# Patient Record
Sex: Female | Born: 1995 | Race: Black or African American | Hispanic: No | Smoking: Never smoker
Health system: Southern US, Community
[De-identification: ages and names within clinical notes are randomized; demographics above are authoritative.]

## PROBLEM LIST (undated history)

## (undated) DIAGNOSIS — L309 Dermatitis, unspecified: Secondary | ICD-10-CM

## (undated) DIAGNOSIS — R309 Painful micturition, unspecified: Secondary | ICD-10-CM

## (undated) DIAGNOSIS — B9689 Other specified bacterial agents as the cause of diseases classified elsewhere: Secondary | ICD-10-CM

## (undated) DIAGNOSIS — N390 Urinary tract infection, site not specified: Principal | ICD-10-CM

## (undated) DIAGNOSIS — J302 Other seasonal allergic rhinitis: Secondary | ICD-10-CM

## (undated) DIAGNOSIS — J45909 Unspecified asthma, uncomplicated: Secondary | ICD-10-CM

## (undated) DIAGNOSIS — R350 Frequency of micturition: Secondary | ICD-10-CM

## (undated) DIAGNOSIS — N760 Acute vaginitis: Secondary | ICD-10-CM

## (undated) HISTORY — DX: Urinary tract infection, site not specified: N39.0

## (undated) HISTORY — DX: Painful micturition, unspecified: R30.9

## (undated) HISTORY — DX: Other seasonal allergic rhinitis: J30.2

## (undated) HISTORY — DX: Unspecified asthma, uncomplicated: J45.909

## (undated) HISTORY — PX: NO PAST SURGERIES: SHX2092

## (undated) HISTORY — DX: Dermatitis, unspecified: L30.9

## (undated) HISTORY — DX: Frequency of micturition: R35.0

---
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
## 2016-01-23 ENCOUNTER — Ambulatory Visit: Payer: BLUE CROSS/BLUE SHIELD | Admitting: Advanced Practice Midwife

## 2016-01-29 ENCOUNTER — Ambulatory Visit: Payer: BLUE CROSS/BLUE SHIELD | Admitting: Advanced Practice Midwife

## 2016-04-16 ENCOUNTER — Other Ambulatory Visit: Payer: Self-pay | Admitting: Pediatrics

## 2016-04-16 ENCOUNTER — Telehealth: Payer: Self-pay

## 2016-04-16 DIAGNOSIS — J4531 Mild persistent asthma with (acute) exacerbation: Secondary | ICD-10-CM

## 2016-04-16 MED ORDER — ALBUTEROL SULFATE HFA 108 (90 BASE) MCG/ACT IN AERS
2.0000 | INHALATION_SPRAY | RESPIRATORY_TRACT | 0 refills | Status: DC | PRN
Start: 2016-04-16 — End: 2019-10-14

## 2016-04-16 NOTE — Telephone Encounter (Signed)
She doesn't need the box for a refill, the original pharmacy would have on record - but more likely she needs it at school - she needs to give us more info on the pharmacy

## 2016-04-16 NOTE — Telephone Encounter (Signed)
Called pt back and got phone and fax number for pharmacy. Phone number: [phone] Fax number: [phone]. Pt said that she has trouble when walking to class and will have to stop and take a break. When she is sitting down she can hear herself wheezing. I instructed pt to go to the clinic at school to be checked out as well. She voices understanding.

## 2016-04-16 NOTE — Telephone Encounter (Signed)
Pt called and explained that she has misplaced her Pro Air inhaler. She also can not find the box that has the prescription information on it to request a refill. [facility]. SHe is wondering if we will [facility].

## 2016-04-16 NOTE — Telephone Encounter (Signed)
Script called in

## 2016-11-11 ENCOUNTER — Encounter: Payer: Self-pay | Admitting: Pediatrics

## 2016-11-11 ENCOUNTER — Ambulatory Visit (INDEPENDENT_AMBULATORY_CARE_PROVIDER_SITE_OTHER): Payer: BLUE CROSS/BLUE SHIELD | Admitting: Pediatrics

## 2016-11-11 VITALS — BP 100/70 | Temp 97.4°F | Ht 66.93 in | Wt 143.8 lb

## 2016-11-11 DIAGNOSIS — Z23 Encounter for immunization: Secondary | ICD-10-CM

## 2016-11-11 DIAGNOSIS — L243 Irritant contact dermatitis due to cosmetics: Secondary | ICD-10-CM

## 2016-11-11 DIAGNOSIS — Z0000 Encounter for general adult medical examination without abnormal findings: Secondary | ICD-10-CM

## 2016-11-11 MED ORDER — TRIAMCINOLONE ACETONIDE 0.1 % EX OINT: 1.0000 "application " | TOPICAL_OINTMENT | Freq: Two times a day (BID) | CUTANEOUS | 3 refills | Status: DC

## 2016-11-11 NOTE — Progress Notes (Signed)
Asthma winter  eczema [id number] Routine Well-Adolescent Visit  Theresa David's personal or confidential phone number: [phone]  PCP: Theresa David, Theresa Client, MD   History was provided by the patient.  Theresa David is a 21 y.o. female who is here for well check.   Current concerns: feels she has flare of eczema on her armpits, is pruritic, no change in deoderant , no other rashes Has h/o asthma, generally only has issues in winter now, may use her inhaler>2x/week then ,has not needed since Jan.  Is in college, and works  Allergies  Allergen Reactions  . Shellfish Allergy Anaphylaxis and Rash    SEAFOOD    Current Outpatient Prescriptions on File Prior to Visit  Medication Sig Dispense Refill  . albuterol (PROAIR HFA) 108 (90 Base) MCG/ACT inhaler Inhale 2 puffs into the lungs every 4 (four) hours as needed for wheezing (Use with spacer). 18 g 0  . EPINEPHrine (EPIPEN) 0.3 mg/0.3 mL DEVI Inject 0.3 mLs (0.3 mg total) into the muscle once. 2 Device 1  . mometasone (ELOCON) 0.1 % cream Apply 1 application topically daily. 45 g 0   No current facility-administered medications on file prior to visit.     Past Medical History:  Diagnosis Date  . Asthma   . Bacterial vaginosis   . Eczema 09/06/2012  . Pain with urination 04/26/2015  . Seasonal allergies 09/06/2012  . Unspecified asthma(493.90) 09/06/2012  . Urinary frequency 08/01/2013  . UTI (lower urinary tract infection) 08/01/2013    ROS:     Constitutional  Afebrile, normal appetite, normal activity.   Opthalmologic  no irritation or drainage.   ENT  no rhinorrhea or congestion , no sore throat, no ear pain. Cardiovascular  No chest pain Respiratory  no cough , wheeze or chest pain.  Gastrointestinal  no abdominal pain, nausea or vomiting, bowel movements normal.     Genitourinary  no urgency, frequency or dysuria.   Musculoskeletal  no complaints of pain, no injuries.   Dermatologic  no rashes or lesions Neurologic - no  significant history of headaches, no weakness  family history includes Cancer (age of onset: 59) in her maternal aunt.    Adolescent Assessment:  Confidentiality was discussed with the patient and if applicable, with caregiver as well.  Home and Environment:  Social History   Social History Narrative   Attends college. Wants to be pediatric nurse   Lives with mom    No  smokers     Sports/Exercise:  regularly participates in sports coaches cheerleading  Education and Employment:  School Status: in college i School History: School attendance is regular. Work: Glass blower/designer Activities: cheerleading With parent out of the room and confidentiality discussed:  Patient here alone - adult  Patient reports being comfortable and safe at school and at home? Yes  Smoking: no Secondhand smoke exposure? no Drugs/EtOH: no   Sexuality:  -Menarche: age - females:  last menses: no problems  - Sexually active? yes -   - sexual partners in last year:  - contraception use: sees gyn - Last STI Screening: 05/2015  - Violence/Abuse: no  Mood: Suicidality and Depression: no Weapons:   Screenings:   PHQ-9 completed and results indicated no significant issues score 2   Hearing Screening   125Hz  250Hz  500Hz  1000Hz  2000Hz  3000Hz  4000Hz  6000Hz  8000Hz   Right ear:   25 25 25 25 25     Left ear:   25 25 25 25 25       Visual Acuity  Screening   Right eye Left eye Both eyes  Without correction: 20/20 20/20   With correction:         Physical Exam:  BP 100/70   Temp 97.4 F (36.3 C) (Temporal)   Ht 5' 6.93" (1.7 m)   Wt 143 lb 12.8 oz (65.2 kg)   BMI 22.57 kg/m   Weight: 74 %ile (Z= 0.64) based on CDC 2-20 Years weight-for-age data using vitals from 11/11/2016. Normalized weight-for-stature data available only for age 2 to 5 years.  Height: 85 %ile (Z= 1.03) based on CDC 2-20 Years stature-for-age data using vitals from 11/11/2016.  Blood pressure percentiles are  5.2 % systolic and 66.3 % diastolic based on the August 2017 AAP Clinical Practice Guideline.    Objective:         General alert in NAD  Derm   no rashes or lesions  Head Normocephalic, atraumatic                    Eyes Normal, no discharge  Ears:   TMs normal bilaterally  Nose:   patent normal mucosa, turbinates normal, no rhinorhea  Oral cavity  moist mucous membranes, no lesions  Throat:   normal tonsils, without exudate or erythema  Neck supple FROM  Lymph:   . no significant cervical adenopathy  Lungs:  clear with equal breath sounds bilaterally  Breast   Heart:   regular rate and rhythm, no murmur  Abdomen:  soft nontender no organomegaly or masses  GU:  normal female  back No deformity no scoliosis  Extremities:   no deformity,  Neuro:  intact no focal defects           Assessment/Plan:  1. Encounter for general adult medical examination without abnormal findings Normal growth and development  - POCT urinalysis dipstick - GC/Chlamydia Probe Amp  2. Irritant contact dermatitis due to cosmetics Pattern suggests deoderant as provocative agent. Advised hypoallergenic deoderant or talc - triamcinolone ointment (KENALOG) 0.1 %; Apply 1 application topically 2 (two) times daily.  Dispense: 60 g; Refill: 3  3. Need for vaccination  - HPV 9-valent vaccine,Recombinat .  BMI: is appropriate for age  Counseling completed for all of the following vaccine components  Orders Placed This Encounter  Procedures  . GC/Chlamydia Probe Amp  . HPV 9-valent vaccine,Recombinat  . POCT urinalysis dipstick    Return in 1 year (on 11/11/2017).  Theresa David.   Theresa Arnette Jo Latitia Housewright, MD

## 2016-11-11 NOTE — Patient Instructions (Signed)
Preventive Care for Theresa David, Female The transition to life after high school as a young adult can be a stressful time with many changes. You may start seeing a primary care physician instead of a pediatrician. This is the time when your health care becomes your responsibility. Preventive care refers to lifestyle choices and visits with your health care provider that can promote health and wellness. What does preventive care include?  A yearly physical exam. This is also called an annual wellness visit.  Dental exams once or twice a year.  Routine eye exams. Ask your health care provider how often you should have your eyes checked.  Personal lifestyle choices, including: ? Daily care of your teeth and gums. ? Regular physical activity. ? Eating a healthy diet. ? Avoiding tobacco and drug use. ? Avoiding or limiting alcohol use. ? Practicing safe sex. ? Taking vitamin and mineral supplements as recommended by your health care provider. What happens during an annual wellness visit? Preventive care starts with a yearly visit to your primary care physician. The services and screenings done by your health care provider during your annual wellness visit will depend on your overall health, lifestyle risk factors, and family history of disease. Counseling Your health care provider may ask you questions about:  Past medical problems and your family's medical history.  Medicines or supplements you take.  Health insurance and access to health care.  Alcohol, tobacco, and drug use.  Your safety at home, work, or school.  Access to firearms.  Emotional well-being and how you cope with stress.  Relationship well-being.  Diet, exercise, and sleep habits.  Your sexual health and activity.  Your methods of birth control.  Your menstrual cycle.  Your pregnancy history.  Screening You may have the following tests or measurements:  Height, weight, and BMI.  Blood  pressure.  Lipid and cholesterol levels.  Tuberculosis skin test.  Skin exam.  Vision and hearing tests.  Screening test for hepatitis.  Screening tests for sexually transmitted diseases (STDs), if you are at risk.  BRCA-related cancer screening. This may be done if you have a family history of breast, ovarian, tubal, or peritoneal cancers.  Pelvic exam and Pap test. This may be done every 3 years starting at age 35.  Vaccines Your health care provider may recommend certain vaccines, such as:  Influenza vaccine. This is recommended every year.  Tetanus, diphtheria, and acellular pertussis (Tdap, Td) vaccine. You may need a Td booster every 10 years.  Varicella vaccine. You may need this if you have not been vaccinated.  HPV vaccine. If you are 25 or younger, you may need three doses over 6 months.  Measles, mumps, and rubella (MMR) vaccine. You may need at least one dose of MMR. You may also need a second dose.  Pneumococcal 13-valent conjugate (PCV13) vaccine. You may need this if you have certain conditions and were not previously vaccinated.  Pneumococcal polysaccharide (PPSV23) vaccine. You may need one or two doses if you smoke cigarettes or if you have certain conditions.  Meningococcal vaccine. One dose is recommended if you are age 24-21 years and a first-year college student living in a residence hall, or if you have one of several medical conditions. You may also need additional booster doses.  Hepatitis A vaccine. You may need this if you have certain conditions or if you travel or work in places where you may be exposed to hepatitis A.  Hepatitis B vaccine. You may need this if  you have certain conditions or if you travel or work in places where you may be exposed to hepatitis B.  Haemophilus influenzae type b (Hib) vaccine. You may need this if you have certain risk factors.  Talk to your health care provider about which screenings and vaccines you need and how  often you need them. What steps can I take to develop healthy behaviors?  Have regular preventive health care visits with your primary care physician and dentist.  Eat a healthy diet.  Drink enough fluid to keep your urine clear or pale yellow.  Stay active. Exercise at least 30 minutes 5 or more days of the week.  Use alcohol responsibly.  Maintain a healthy weight.  Do not use any products that contain nicotine, such as cigarettes, chewing tobacco, and e-cigarettes. If you need help quitting, ask your health care provider.  Do not use drugs.  Practice safe sex.  Use birth control (contraception) to prevent unwanted pregnancy. If you plan to become pregnant, see your health care provider for a pre-conception visit.  Find healthy ways to manage stress. How can I protect myself from injury? Injuries from violence or accidents are the leading cause of death among young adults and can often be prevented. Take these steps to help protect yourself:  Always wear your seat belt while driving or riding in a vehicle.  Do not drive if you have been drinking alcohol. Do not ride with someone who has been drinking.  Do not drive when you are tired or distracted. Do not text while driving.  Wear a helmet and other protective equipment during sports activities.  If you have firearms in your house, make sure you follow all gun safety procedures.  Seek help if you have been bullied, physically abused, or sexually abused.  Use the Internet responsibly to avoid dangers such as online bullying and online sexual predators.  What can I do to cope with stress? Young adults may face many new challenges that can be stressful, such as finding a job, going to college, moving away from home, managing money, being in a relationship, getting married, and having children. To manage stress:  Avoid known stressful situations when you can.  Exercise regularly.  Find a stress-reducing activity that  works best for you. Examples include meditation, yoga, listening to music, or reading.  Spend time in nature.  Keep a journal to write about your stress and how you respond.  Talk to your health care provider about stress. He or she may suggest counseling.  Spend time with supportive friends or family.  Do not cope with stress by: ? Drinking alcohol or using drugs. ? Smoking cigarettes. ? Eating.  Where can I get more information? Learn more about preventive care and healthy habits from:  [place] and Gynecologists: KaraokeExchange.nl  U.S. Probation officer Task Force: StageSync.si  National Adolescent and [place]: StrategicRoad.nl  American Academy of Pediatrics Bright Futures: https://brightfutures.MemberVerification.co.za  Society for Adolescent Health and Medicine: MoralBlog.co.za.aspx  PodExchange.nl: ToyLending.fr  This information is not intended to replace advice given to you by your health care provider. Make sure you discuss any questions you have with your health care provider. Document Released: 09/20/2015 Document Revised: 10/11/2015 Document Reviewed: 09/20/2015 Elsevier Interactive Patient Education  2017 [employer].

## 2016-11-13 LAB — GC/CHLAMYDIA PROBE AMP
Chlamydia trachomatis, NAA: NEGATIVE
Neisseria gonorrhoeae by PCR: NEGATIVE

## 2017-02-11 ENCOUNTER — Encounter: Payer: Self-pay | Admitting: Women's Health

## 2017-02-11 ENCOUNTER — Encounter (INDEPENDENT_AMBULATORY_CARE_PROVIDER_SITE_OTHER): Payer: Self-pay

## 2017-02-11 ENCOUNTER — Ambulatory Visit (INDEPENDENT_AMBULATORY_CARE_PROVIDER_SITE_OTHER): Payer: BLUE CROSS/BLUE SHIELD | Admitting: Women's Health

## 2017-02-11 VITALS — BP 140/90 | HR 71 | Ht 65.0 in | Wt 143.0 lb

## 2017-02-11 DIAGNOSIS — Z3490 Encounter for supervision of normal pregnancy, unspecified, unspecified trimester: Secondary | ICD-10-CM | POA: Insufficient documentation

## 2017-02-11 DIAGNOSIS — Z3491 Encounter for supervision of normal pregnancy, unspecified, first trimester: Secondary | ICD-10-CM

## 2017-02-11 DIAGNOSIS — N926 Irregular menstruation, unspecified: Secondary | ICD-10-CM | POA: Diagnosis not present

## 2017-02-11 DIAGNOSIS — Z3201 Encounter for pregnancy test, result positive: Secondary | ICD-10-CM | POA: Diagnosis not present

## 2017-02-11 LAB — POCT URINE PREGNANCY: Preg Test, Ur: POSITIVE — AB

## 2017-02-11 NOTE — Patient Instructions (Signed)

## 2017-02-11 NOTE — Progress Notes (Signed)
   [facility] Clinic Visit  Patient name: Theresa David MRN [id number]  Date of birth: 1995-06-10 CC & HPI:  Theresa David is a 21 y.o. G46P0000 African American female at [redacted]w[redacted]d by LMP, being seen today for +HPT on Friday. Denies n/v. Has started taking otc gummie prenatal vitamins daily. Does not take any other medicines. Does not smoke. No complaints. Denies h/o HTN, just anxious about being here today.    Patient's last menstrual period was 01/06/2017. Last pap <21yo  Review of Systems:   Patient denies any headaches, hearing loss, fatigue, blurred vision, shortness of breath, chest pain, abdominal pain, problems with bowel movements, urination, or intercourse. No joint pain or mood swings.  Pertinent History Reviewed:  Reviewed past medical,surgical and family history.  Reviewed problem list, medications and allergies.  Objective Findings:   Vitals:   02/11/17 1204  BP: 140/90  Pulse: 71  Weight: 143 lb (64.9 kg)  Height:  (1.651 m)    Body mass index is 23.8 kg/m.  Physical Examination: General appearance - well appearing, and in no distress Mental status - alert, oriented to person, place, and time  Results for orders placed or performed in visit on 02/11/17 (from the past 24 hour(s))  POCT urine pregnancy   Collection Time: 02/11/17 12:06 PM  Result Value Ref Range   Preg Test, Ur Positive (A) Negative     Assessment & Plan:   1) [redacted]w[redacted]d pregnant by LMP>continue pnv daily, discussed warning s/s to report  Orders Placed This Encounter  Procedures  . POCT urine pregnancy    Return in about 2 Mika (around 02/25/2017) for dating u/s.  Marge Duncans CNM, [facility] 02/11/2017 12:18 PM

## 2017-02-19 ENCOUNTER — Telehealth: Payer: Self-pay | Admitting: Obstetrics & Gynecology

## 2017-02-19 NOTE — Telephone Encounter (Signed)
Informed patient she could try taking Colace or Miralax to help with constipation. Advise to push fluids and increase fiber intake or green leafy veggies, fiber one bars. Verbalized understanding.

## 2017-02-25 ENCOUNTER — Ambulatory Visit (INDEPENDENT_AMBULATORY_CARE_PROVIDER_SITE_OTHER): Payer: BLUE CROSS/BLUE SHIELD

## 2017-02-25 DIAGNOSIS — Z3491 Encounter for supervision of normal pregnancy, unspecified, first trimester: Secondary | ICD-10-CM

## 2017-02-25 DIAGNOSIS — Z3A01 Less than 8 weeks gestation of pregnancy: Secondary | ICD-10-CM

## 2017-02-25 NOTE — Progress Notes (Signed)
[place] 7+1 wks,single IUP w/ys,positive fht 126 bpm,normal ovaries bilat,crl 10.2 mm,EDD 10/13/2017 by LMP

## 2017-03-11 ENCOUNTER — Ambulatory Visit: Payer: BLUE CROSS/BLUE SHIELD | Admitting: *Deleted

## 2017-03-11 ENCOUNTER — Encounter: Payer: Self-pay | Admitting: Advanced Practice Midwife

## 2017-03-11 ENCOUNTER — Ambulatory Visit (INDEPENDENT_AMBULATORY_CARE_PROVIDER_SITE_OTHER): Payer: BLUE CROSS/BLUE SHIELD | Admitting: Advanced Practice Midwife

## 2017-03-11 VITALS — BP 112/74 | HR 72 | Wt 145.0 lb

## 2017-03-11 DIAGNOSIS — Z3A09 9 weeks gestation of pregnancy: Secondary | ICD-10-CM | POA: Diagnosis not present

## 2017-03-11 DIAGNOSIS — Z3400 Encounter for supervision of normal first pregnancy, unspecified trimester: Secondary | ICD-10-CM

## 2017-03-11 DIAGNOSIS — Z3401 Encounter for supervision of normal first pregnancy, first trimester: Secondary | ICD-10-CM | POA: Diagnosis not present

## 2017-03-11 DIAGNOSIS — O0990 Supervision of high risk pregnancy, unspecified, unspecified trimester: Secondary | ICD-10-CM | POA: Insufficient documentation

## 2017-03-11 DIAGNOSIS — Z1389 Encounter for screening for other disorder: Secondary | ICD-10-CM

## 2017-03-11 DIAGNOSIS — Z3682 Encounter for antenatal screening for nuchal translucency: Secondary | ICD-10-CM

## 2017-03-11 DIAGNOSIS — Z331 Pregnant state, incidental: Secondary | ICD-10-CM

## 2017-03-11 LAB — POCT URINALYSIS DIPSTICK
GLUCOSE UA: NEGATIVE
KETONES UA: NEGATIVE
Leukocytes, UA: NEGATIVE
NITRITE UA: NEGATIVE
Protein, UA: NEGATIVE
RBC UA: NEGATIVE

## 2017-03-11 MED ORDER — DOXYLAMINE-PYRIDOXINE 10-10 MG PO TBEC: DELAYED_RELEASE_TABLET | ORAL | 3 refills | Status: DC

## 2017-03-11 NOTE — Patient Instructions (Signed)
 First Trimester of Pregnancy The first trimester of pregnancy is from week 1 until the end of week 12 (months 1 through 3). A week after a sperm fertilizes an egg, the egg will implant on the wall of the uterus. This embryo will begin to develop into a baby. Genes from you and your partner are forming the baby. The female genes determine whether the baby is a boy or a girl. At 6-8 Panjwani, the eyes and face are formed, and the heartbeat can be seen on ultrasound. At the end of 12 [place], all the baby's organs are formed.  Now that you are pregnant, you will want to do everything you can to have a healthy baby. Two of the most important things are to get good prenatal care and to follow your health care provider's instructions. Prenatal care is all the medical care you receive before the baby's birth. This care will help prevent, find, and treat any problems during the pregnancy and childbirth. BODY CHANGES Your body goes through many changes during pregnancy. The changes vary from woman to woman.   You may gain or lose a couple of pounds at first.  You may feel sick to your stomach (nauseous) and throw up (vomit). If the vomiting is uncontrollable, call your health care provider.  You may tire easily.  You may develop headaches that can be relieved by medicines approved by your health care provider.  You may urinate more often. Painful urination may mean you have a bladder infection.  You may develop heartburn as a result of your pregnancy.  You may develop constipation because certain hormones are causing the muscles that push waste through your intestines to slow down.  You may develop hemorrhoids or swollen, bulging veins (varicose veins).  Your breasts may begin to grow larger and become tender. Your nipples may stick out more, and the tissue that surrounds them (areola) may become darker.  Your gums may bleed and may be sensitive to brushing and flossing.  Dark spots or blotches  (chloasma, mask of pregnancy) may develop on your face. This will likely fade after the baby is born.  Your menstrual periods will stop.  You may have a loss of appetite.  You may develop cravings for certain kinds of food.  You may have changes in your emotions from day to day, such as being excited to be pregnant or being concerned that something may go wrong with the pregnancy and baby.  You may have more vivid and strange dreams.  You may have changes in your hair. These can include thickening of your hair, rapid growth, and changes in texture. Some women also have hair loss during or after pregnancy, or hair that feels dry or thin. Your hair will most likely return to normal after your baby is born. WHAT TO EXPECT AT YOUR PRENATAL VISITS During a routine prenatal visit:  You will be weighed to make sure you and the baby are growing normally.  Your blood pressure will be taken.  Your abdomen will be measured to track your baby's growth.  The fetal heartbeat will be listened to starting around week 10 or 12 of your pregnancy.  Test results from any previous visits will be discussed. Your health care provider may ask you:  How you are feeling.  If you are feeling the baby move.  If you have had any abnormal symptoms, such as leaking fluid, bleeding, severe headaches, or abdominal cramping.  If you have any questions. Other   tests that may be performed during your first trimester include:  Blood tests to find your blood type and to check for the presence of any previous infections. They will also be used to check for low iron levels (anemia) and Rh antibodies. Later in the pregnancy, blood tests for diabetes will be done along with other tests if problems develop.  Urine tests to check for infections, diabetes, or protein in the urine.  An ultrasound to confirm the proper growth and development of the baby.  An amniocentesis to check for possible genetic problems.  Fetal  screens for spina bifida and Down syndrome.  You may need other tests to make sure you and the baby are doing well. HOME CARE INSTRUCTIONS  Medicines  Follow your health care provider's instructions regarding medicine use. Specific medicines may be either safe or unsafe to take during pregnancy.  Take your prenatal vitamins as directed.  If you develop constipation, try taking a stool softener if your health care provider approves. Diet  Eat regular, well-balanced meals. Choose a variety of foods, such as meat or vegetable-based protein, fish, milk and low-fat dairy products, vegetables, fruits, and whole grain breads and cereals. Your health care provider will help you determine the amount of weight gain that is right for you.  Avoid raw meat and uncooked cheese. These carry germs that can cause birth defects in the baby.  Eating four or five small meals rather than three large meals a day may help relieve nausea and vomiting. If you start to feel nauseous, eating a few soda crackers can be helpful. Drinking liquids between meals instead of during meals also seems to help nausea and vomiting.  If you develop constipation, eat more high-fiber foods, such as fresh vegetables or fruit and whole grains. Drink enough fluids to keep your urine clear or pale yellow. Activity and Exercise  Exercise only as directed by your health care provider. Exercising will help you:  Control your weight.  Stay in shape.  Be prepared for labor and delivery.  Experiencing pain or cramping in the lower abdomen or low back is a good sign that you should stop exercising. Check with your health care provider before continuing normal exercises.  Try to avoid standing for long periods of time. Move your legs often if you must stand in one place for a long time.  Avoid heavy lifting.  Wear low-heeled shoes, and practice good posture.  You may continue to have sex unless your health care provider directs you  otherwise. Relief of Pain or Discomfort  Wear a good support bra for breast tenderness.   Take warm sitz baths to soothe any pain or discomfort caused by hemorrhoids. Use hemorrhoid cream if your health care provider approves.   Rest with your legs elevated if you have leg cramps or low back pain.  If you develop varicose veins in your legs, wear support hose. Elevate your feet for 15 minutes, 3-4 times a day. Limit salt in your diet. Prenatal Care  Schedule your prenatal visits by the twelfth week of pregnancy. They are usually scheduled monthly at first, then more often in the last 2 months before delivery.  Write down your questions. Take them to your prenatal visits.  Keep all your prenatal visits as directed by your health care provider. Safety  Wear your seat belt at all times when driving.  Make a list of emergency phone numbers, including numbers for family, friends, the hospital, and police and fire departments. General   Tips  Ask your health care provider for a referral to a local prenatal education class. Begin classes no later than at the beginning of month 6 of your pregnancy.  Ask for help if you have counseling or nutritional needs during pregnancy. Your health care provider can offer advice or refer you to specialists for help with various needs.  Do not use hot tubs, steam rooms, or saunas.  Do not douche or use tampons or scented sanitary pads.  Do not cross your legs for long periods of time.  Avoid cat litter boxes and soil used by cats. These carry germs that can cause birth defects in the baby and possibly loss of the fetus by miscarriage or stillbirth.  Avoid all smoking, herbs, alcohol, and medicines not prescribed by your health care provider. Chemicals in these affect the formation and growth of the baby.  Schedule a dentist appointment. At home, brush your teeth with a soft toothbrush and be gentle when you floss. SEEK MEDICAL CARE IF:   You have  dizziness.  You have mild pelvic cramps, pelvic pressure, or nagging pain in the abdominal area.  You have persistent nausea, vomiting, or diarrhea.  You have a bad smelling vaginal discharge.  You have pain with urination.  You notice increased swelling in your face, hands, legs, or ankles. SEEK IMMEDIATE MEDICAL CARE IF:   You have a fever.  You are leaking fluid from your vagina.  You have spotting or bleeding from your vagina.  You have severe abdominal cramping or pain.  You have rapid weight gain or loss.  You vomit blood or material that looks like coffee grounds.  You are exposed to German measles and have never had them.  You are exposed to fifth disease or chickenpox.  You develop a severe headache.  You have shortness of breath.  You have any kind of trauma, such as from a fall or a car accident. Document Released: 04/29/2001 Document Revised: 09/19/2013 Document Reviewed: 03/15/2013 Exit[employer] Patient Information 2015 [employer]. This information is not intended to replace advice given to you by your health care provider. Make sure you discuss any questions you have with your health care provider.   Nausea & Vomiting  Have saltine crackers or pretzels by your bed and eat a few bites before you raise your head out of bed in the morning  Eat small frequent meals throughout the day instead of large meals  Drink plenty of fluids throughout the day to stay hydrated, just don't drink a lot of fluids with your meals.  This can make your stomach fill up faster making you feel sick  Do not brush your teeth right after you eat  Products with real ginger are good for nausea, like ginger ale and ginger hard candy Make sure it says made with real ginger!  Sucking on sour candy like lemon heads is also good for nausea  If your prenatal vitamins make you nauseated, take them at night so you will sleep through the nausea  Sea Bands  If you feel like you need  medicine for the nausea & vomiting please let us know  If you are unable to keep any fluids or food down please let us know   Constipation  Drink plenty of fluid, preferably water, throughout the day  Eat foods high in fiber such as fruits, vegetables, and grains  Exercise, such as walking, is a good way to keep your bowels regular  Drink warm fluids, especially warm   prune juice, or decaf coffee  Eat a 1/2 cup of real oatmeal (not instant), 1/2 cup applesauce, and 1/2-1 cup warm prune juice every day  If needed, you may take Colace (docusate sodium) stool softener once or twice a day to help keep the stool soft. If you are pregnant, wait until you are out of your first trimester (12-14 [place] of pregnancy)  If you still are having problems with constipation, you may take Miralax once daily as needed to help keep your bowels regular.  If you are pregnant, wait until you are out of your first trimester (12-14 [place] of pregnancy)  Safe Medications in Pregnancy   Acne: Benzoyl Peroxide Salicylic Acid  Backache/Headache: Tylenol: 2 regular strength every 4 hours OR              2 Extra strength every 6 hours  Colds/Coughs/Allergies: Benadryl (alcohol free) 25 mg every 6 hours as needed Breath right strips Claritin Cepacol throat lozenges Chloraseptic throat spray Cold-Eeze- up to three times per day Cough drops, alcohol free Flonase (by prescription only) Guaifenesin Mucinex Robitussin DM (plain only, alcohol free) Saline nasal spray/drops Sudafed (pseudoephedrine) & Actifed ** use only after 12 Gadea gestation and if you do not have high blood pressure Tylenol Vicks Vaporub Zinc lozenges Zyrtec   Constipation: Colace Ducolax suppositories Fleet enema Glycerin suppositories Metamucil Milk of magnesia Miralax Senokot Smooth move tea  Diarrhea: Kaopectate Imodium A-D  *NO pepto Bismol  Hemorrhoids: Anusol Anusol HC Preparation  H Tucks  Indigestion: Tums Maalox Mylanta Zantac  Pepcid  Insomnia: Benadryl (alcohol free) 25mg every 6 hours as needed Tylenol PM Unisom, no Gelcaps  Leg Cramps: Tums MagGel  Nausea/Vomiting:  Bonine Dramamine Emetrol Ginger extract Sea bands Meclizine  Nausea medication to take during pregnancy:  Unisom (doxylamine succinate 25 mg tablets) Take one tablet daily at bedtime. If symptoms are not adequately controlled, the dose can be increased to a maximum recommended dose of two tablets daily (1/2 tablet in the morning, 1/2 tablet mid-afternoon and one at bedtime). Vitamin B6 100mg tablets. Take one tablet twice a day (up to 200 mg per day).  Skin Rashes: Aveeno products Benadryl cream or 25mg every 6 hours as needed Calamine Lotion 1% cortisone cream  Yeast infection: Gyne-lotrimin 7 Monistat 7   **If taking multiple medications, please check labels to avoid duplicating the same active ingredients **take medication as directed on the label ** Do not exceed 4000 mg of tylenol in 24 hours **Do not take medications that contain aspirin or ibuprofen      

## 2017-03-11 NOTE — Progress Notes (Signed)
  Subjective:    Theresa GoslingValencia David is a G1P0000 6633w1d being seen today for her first obstetrical visit.  Her obstetrical history is significant for first pregnancy.  Pregnancy history fully reviewed.  Patient reports nausea.  Vitals:   03/11/17 1032  BP: 112/74  Pulse: 72  Weight: 145 lb (65.8 kg)    HISTORY: OB History  Gravida Para Term Preterm AB Living  1 0 0 0 0 0  SAB TAB Ectopic Multiple Live Births  0 0 0 0      # Outcome Date GA Lbr Len/2nd Weight Sex Delivery Anes PTL Lv  1 Current              Past Medical History:  Diagnosis Date  . Asthma   . Bacterial vaginosis   . Eczema 09/06/2012  . Pain with urination 04/26/2015  . Seasonal allergies 09/06/2012  . Unspecified asthma(493.90) 09/06/2012  . Urinary frequency 08/01/2013  . UTI (lower urinary tract infection) 08/01/2013   History reviewed. No pertinent surgical history. Family History  Problem Relation Age of Onset  . Cancer Maternal Aunt 44       breast   . Dementia Maternal Grandmother      Exam                                      System:     Skin: normal coloration and turgor, no rashes    Neurologic: oriented, normal, normal mood   Extremities: normal strength, tone, and muscle mass   HEENT PERRLA   Mouth/Teeth mucous membranes moist, normal dentition   Neck supple and no masses   Cardiovascular: regular rate and rhythm   Respiratory:  appears well, vitals normal, no respiratory distress, acyanotic   Abdomen: soft, non-tender;  FHR: 160US        The nature [facility] with multiple MDs and other Advanced Practice Providers was explained to patient; also emphasized that residents, students are part of our team.  Assessment:    Pregnancy: G1P0000 Patient Active Problem List   Diagnosis Date Noted  . Supervision of normal first pregnancy, antepartum 03/11/2017  . Stress and adjustment reaction 03/25/2013  . Mild persistent asthma with acute  exacerbation 09/06/2012  . Eczema 09/06/2012        Plan:     Initial labs drawn. Continue prenatal vitamins  Rx diclegis pre auth done)_ Problem list reviewed and updated  Reviewed n/v relief measures and warning s/s to report  Reviewed recommended weight gain based on pre-gravid BMI  Encouraged well-balanced diet Genetic Screening discussed Integrated Screen: requested.  Ultrasound discussed; fetal survey: requested.  Return in about 3 [place] (around 04/01/2017) for LROB, US:NT+1st IT.  CRESENZO-DISHMAN,Clydean Posas 03/11/2017

## 2017-03-14 LAB — PMP SCREEN PROFILE (10S), URINE
AMPHETAMINE SCREEN URINE: NEGATIVE ng/mL
BARBITURATE SCREEN URINE: NEGATIVE ng/mL
BENZODIAZEPINE SCREEN, URINE: NEGATIVE ng/mL
CANNABINOIDS UR QL SCN: NEGATIVE ng/mL
COCAINE(METAB.)SCREEN, URINE: NEGATIVE ng/mL
Creatinine(Crt), U: 193.1 mg/dL (ref 20.0–300.0)
METHADONE SCREEN, URINE: NEGATIVE ng/mL
OPIATE SCREEN URINE: NEGATIVE ng/mL
OXYCODONE+OXYMORPHONE UR QL SCN: NEGATIVE ng/mL
PHENCYCLIDINE QUANTITATIVE URINE: NEGATIVE ng/mL
PROPOXYPHENE SCREEN URINE: NEGATIVE ng/mL
Ph of Urine: 8.4 (ref 4.5–8.9)

## 2017-03-14 LAB — URINE CULTURE

## 2017-03-15 LAB — GC/CHLAMYDIA PROBE AMP
Chlamydia trachomatis, NAA: NEGATIVE
Neisseria gonorrhoeae by PCR: NEGATIVE

## 2017-03-18 LAB — MICROSCOPIC EXAMINATION: Casts: NONE SEEN /lpf

## 2017-03-18 LAB — CYSTIC FIBROSIS MUTATION 97: Interpretation: NOT DETECTED

## 2017-03-18 LAB — VARICELLA ZOSTER ANTIBODY, IGG

## 2017-03-18 LAB — URINALYSIS, ROUTINE W REFLEX MICROSCOPIC
BILIRUBIN UA: NEGATIVE
Glucose, UA: NEGATIVE
Ketones, UA: NEGATIVE
NITRITE UA: NEGATIVE
PH UA: 8 — AB (ref 5.0–7.5)
Protein, UA: NEGATIVE
RBC UA: NEGATIVE
Specific Gravity, UA: 1.022 (ref 1.005–1.030)
UUROB: 0.2 mg/dL (ref 0.2–1.0)

## 2017-03-18 LAB — SICKLE CELL SCREEN: Sickle Cell Screen: NEGATIVE

## 2017-03-18 LAB — RUBELLA SCREEN: Rubella Antibodies, IGG: 3.57 index (ref >0.99)

## 2017-03-18 LAB — CBC
HEMATOCRIT: 36.2 % (ref 34.0–46.6)
HEMOGLOBIN: 11.8 g/dL (ref 11.1–15.9)
MCH: 29.1 pg (ref 26.6–33.0)
MCHC: 32.6 g/dL (ref 31.5–35.7)
MCV: 89 fL (ref 79–97)
Platelets: 281 10*3/uL (ref 150–379)
RBC: 4.06 x10E6/uL (ref 3.77–5.28)
RDW: 14.3 % (ref 12.3–15.4)
WBC: 5 10*3/uL (ref 3.4–10.8)

## 2017-03-18 LAB — RPR: RPR: NONREACTIVE

## 2017-03-18 LAB — HIV ANTIBODY (ROUTINE TESTING W REFLEX): HIV Screen 4th Generation wRfx: NONREACTIVE

## 2017-03-18 LAB — ANTIBODY SCREEN: ANTIBODY SCREEN: NEGATIVE

## 2017-03-18 LAB — ABO/RH: Rh Factor: POSITIVE

## 2017-03-18 LAB — HEPATITIS B SURFACE ANTIGEN: HEP B S AG: NEGATIVE

## 2017-03-31 ENCOUNTER — Other Ambulatory Visit (HOSPITAL_COMMUNITY): Payer: Self-pay | Admitting: Advanced Practice Midwife

## 2017-03-31 DIAGNOSIS — Z3682 Encounter for antenatal screening for nuchal translucency: Secondary | ICD-10-CM

## 2017-04-01 ENCOUNTER — Ambulatory Visit (INDEPENDENT_AMBULATORY_CARE_PROVIDER_SITE_OTHER): Payer: BLUE CROSS/BLUE SHIELD | Admitting: Women's Health

## 2017-04-01 ENCOUNTER — Ambulatory Visit (INDEPENDENT_AMBULATORY_CARE_PROVIDER_SITE_OTHER): Payer: BLUE CROSS/BLUE SHIELD

## 2017-04-01 ENCOUNTER — Encounter: Payer: Self-pay | Admitting: Women's Health

## 2017-04-01 VITALS — BP 110/58 | HR 70 | Wt 144.5 lb

## 2017-04-01 DIAGNOSIS — Z3682 Encounter for antenatal screening for nuchal translucency: Secondary | ICD-10-CM

## 2017-04-01 DIAGNOSIS — Z3401 Encounter for supervision of normal first pregnancy, first trimester: Secondary | ICD-10-CM

## 2017-04-01 DIAGNOSIS — Z3400 Encounter for supervision of normal first pregnancy, unspecified trimester: Secondary | ICD-10-CM

## 2017-04-01 DIAGNOSIS — Z331 Pregnant state, incidental: Secondary | ICD-10-CM | POA: Diagnosis not present

## 2017-04-01 DIAGNOSIS — Z23 Encounter for immunization: Secondary | ICD-10-CM

## 2017-04-01 DIAGNOSIS — Z3A12 12 weeks gestation of pregnancy: Secondary | ICD-10-CM

## 2017-04-01 DIAGNOSIS — Z1389 Encounter for screening for other disorder: Secondary | ICD-10-CM

## 2017-04-01 LAB — POCT URINALYSIS DIPSTICK
GLUCOSE UA: NEGATIVE
Ketones, UA: NEGATIVE
Leukocytes, UA: NEGATIVE
NITRITE UA: NEGATIVE
PROTEIN UA: NEGATIVE
RBC UA: NEGATIVE

## 2017-04-01 NOTE — Progress Notes (Signed)
US 12+1 wks,measurements c/w dates,crl 55.92 mm,NT 1 mm,NB present,fhr 162 bpm,normal ovaries bilat

## 2017-04-01 NOTE — Progress Notes (Signed)
   LOW-RISK PREGNANCY VISIT Patient name: Theresa David MRN [id number]  Date of birth: 1995/06/17 Chief Complaint:   Routine Prenatal Visit (Intergrated 1 )  History of Present Illness:   Theresa David is a 21 y.o. 261P0000 female at 4314w1d with an Estimated Date of Delivery: 10/13/17 being seen today for ongoing management of a low-risk pregnancy.  Today she reports no complaints.  . Vag. Bleeding: None.  Movement: Absent. denies leaking of fluid. Review of Systems:   Pertinent items are noted in HPI Denies abnormal vaginal discharge w/ itching/odor/irritation, headaches, visual changes, shortness of breath, chest pain, abdominal pain, severe nausea/vomiting, or problems with urination or bowel movements unless otherwise stated above. Pertinent History Reviewed:  Reviewed past medical,surgical, social, obstetrical and family history.  Reviewed problem list, medications and allergies. Physical Assessment:   Vitals:   04/01/17 1016  BP: (!) 110/58  Pulse: 70  Weight: 144 lb 8 oz (65.5 kg)  Body mass index is 24.05 kg/m.        Physical Examination:   General appearance: Well appearing, and in no distress  Mental status: Alert, oriented to person, place, and time  Skin: Warm & dry  Cardiovascular: Normal heart rate noted  Respiratory: Normal respiratory effort, no distress  Abdomen: Soft, gravid, nontender  Pelvic: Cervical exam deferred         Extremities: Edema: None  Fetal Status: Fetal Heart Rate (bpm): + u/s   Movement: Absent   NT U/S: US 12+1 wks,measurements c/w dates,crl 55.92 mm,NT 1 mm,NB present,fhr 162 bpm,normal ovaries bilat  Results for orders placed or performed in visit on 04/01/17 (from the past 24 hour(s))  POCT Urinalysis Dipstick   Collection Time: 04/01/17 10:15 AM  Result Value Ref Range   Color, UA     Clarity, UA     Glucose, UA neg    Bilirubin, UA     Ketones, UA neg    Spec Grav, UA  1.010 - 1.025   Blood, UA neg    pH, UA  5.0 - 8.0   Protein, UA neg    Urobilinogen, UA  0.2 or 1.0 E.U./dL   Nitrite, UA neg    Leukocytes, UA Negative Negative    Assessment & Plan:  1) Low-risk pregnancy G1P0000 at 1214w1d with an Estimated Date of Delivery: 10/13/17    Labs/procedures today: 1st IT/NT, flu shot  Plan:  Continue routine obstetrical care   Reviewed: Preterm labor symptoms and general obstetric precautions including but not limited to vaginal bleeding, contractions, leaking of fluid and fetal movement were reviewed in detail with the patient.  All questions were answered  Follow-up: Return in about 4 [place] (around 04/29/2017) for LROB, 2nd IT.  Orders Placed This Encounter  Procedures  . Integrated 1  . POCT Urinalysis Dipstick   Marge DuncansBooker, Kersten Salmons Randall CNM, [facility]NP-BC 04/01/2017 10:40 AM

## 2017-04-01 NOTE — Patient Instructions (Signed)
Theresa GoslingValencia Gales, I greatly value your feedback.  If you receive a survey following your visit with us today, we appreciate you taking the time to fill it out.  Thanks, Joellyn HaffKim Tyri Elmore, CNM, WHNP-BC   Second Trimester of Pregnancy The second trimester is from week 14 through week 27 (months 4 through 6). The second trimester is often a time when you feel your best. Your body has adjusted to being pregnant, and you begin to feel better physically. Usually, morning sickness has lessened or quit completely, you may have more energy, and you may have an increase in appetite. The second trimester is also a time when the fetus is growing rapidly. At the end of the sixth month, the fetus is about 9 inches long and weighs about 1 pounds. You will likely begin to feel the baby move (quickening) between 16 and 20 [place] of pregnancy. Body changes during your second trimester Your body continues to go through many changes during your second trimester. The changes vary from woman to woman.  Your weight will continue to increase. You will notice your lower abdomen bulging out.  You may begin to get stretch marks on your hips, abdomen, and breasts.  You may develop headaches that can be relieved by medicines. The medicines should be approved by your health care provider.  You may urinate more often because the fetus is pressing on your bladder.  You may develop or continue to have heartburn as a result of your pregnancy.  You may develop constipation because certain hormones are causing the muscles that push waste through your intestines to slow down.  You may develop hemorrhoids or swollen, bulging veins (varicose veins).  You may have back pain. This is caused by: ? Weight gain. ? Pregnancy hormones that are relaxing the joints in your pelvis. ? A shift in weight and the muscles that support your balance.  Your breasts will continue to grow and they will continue to become tender.  Your gums may bleed and  may be sensitive to brushing and flossing.  Dark spots or blotches (chloasma, mask of pregnancy) may develop on your face. This will likely fade after the baby is born.  A dark line from your belly button to the pubic area (linea nigra) may appear. This will likely fade after the baby is born.  You may have changes in your hair. These can include thickening of your hair, rapid growth, and changes in texture. Some women also have hair loss during or after pregnancy, or hair that feels dry or thin. Your hair will most likely return to normal after your baby is born.  What to expect at prenatal visits During a routine prenatal visit:  You will be weighed to make sure you and the fetus are growing normally.  Your blood pressure will be taken.  Your abdomen will be measured to track your baby's growth.  The fetal heartbeat will be listened to.  Any test results from the previous visit will be discussed.  Your health care provider may ask you:  How you are feeling.  If you are feeling the baby move.  If you have had any abnormal symptoms, such as leaking fluid, bleeding, severe headaches, or abdominal cramping.  If you are using any tobacco products, including cigarettes, chewing tobacco, and electronic cigarettes.  If you have any questions.  Other tests that may be performed during your second trimester include:  Blood tests that check for: ? Low iron levels (anemia). ? High blood  sugar that affects pregnant women (gestational diabetes) between 55 and 28 [place]. ? Rh antibodies. This is to check for a protein on red blood cells (Rh factor).  Urine tests to check for infections, diabetes, or protein in the urine.  An ultrasound to confirm the proper growth and development of the baby.  An amniocentesis to check for possible genetic problems.  Fetal screens for spina bifida and Down syndrome.  HIV (human immunodeficiency virus) testing. Routine prenatal testing includes  screening for HIV, unless you choose not to have this test.  Follow these instructions at home: Medicines  Follow your health care provider's instructions regarding medicine use. Specific medicines may be either safe or unsafe to take during pregnancy.  Take a prenatal vitamin that contains at least 600 micrograms (mcg) of folic acid.  If you develop constipation, try taking a stool softener if your health care provider approves. Eating and drinking  Eat a balanced diet that includes fresh fruits and vegetables, whole grains, good sources of protein such as meat, eggs, or tofu, and low-fat dairy. Your health care provider will help you determine the amount of weight gain that is right for you.  Avoid raw meat and uncooked cheese. These carry germs that can cause birth defects in the baby.  If you have low calcium intake from food, talk to your health care provider about whether you should take a daily calcium supplement.  Limit foods that are high in fat and processed sugars, such as fried and sweet foods.  To prevent constipation: ? Drink enough fluid to keep your urine clear or pale yellow. ? Eat foods that are high in fiber, such as fresh fruits and vegetables, whole grains, and beans. Activity  Exercise only as directed by your health care provider. Most women can continue their usual exercise routine during pregnancy. Try to exercise for 30 minutes at least 5 days a week. Stop exercising if you experience uterine contractions.  Avoid heavy lifting, wear low heel shoes, and practice good posture.  A sexual relationship may be continued unless your health care provider directs you otherwise. Relieving pain and discomfort  Wear a good support bra to prevent discomfort from breast tenderness.  Take warm sitz baths to soothe any pain or discomfort caused by hemorrhoids. Use hemorrhoid cream if your health care provider approves.  Rest with your legs elevated if you have leg cramps  or low back pain.  If you develop varicose veins, wear support hose. Elevate your feet for 15 minutes, 3-4 times a day. Limit salt in your diet. Prenatal Care  Write down your questions. Take them to your prenatal visits.  Keep all your prenatal visits as told by your health care provider. This is important. Safety  Wear your seat belt at all times when driving.  Make a list of emergency phone numbers, including numbers for family, friends, the hospital, and police and fire departments. General instructions  Ask your health care provider for a referral to a local prenatal education class. Begin classes no later than the beginning of month 6 of your pregnancy.  Ask for help if you have counseling or nutritional needs during pregnancy. Your health care provider can offer advice or refer you to specialists for help with various needs.  Do not use hot tubs, steam rooms, or saunas.  Do not douche or use tampons or scented sanitary pads.  Do not cross your legs for long periods of time.  Avoid cat litter boxes and soil used  by cats. These carry germs that can cause birth defects in the baby and possibly loss of the fetus by miscarriage or stillbirth.  Avoid all smoking, herbs, alcohol, and unprescribed drugs. Chemicals in these products can affect the formation and growth of the baby.  Do not use any products that contain nicotine or tobacco, such as cigarettes and e-cigarettes. If you need help quitting, ask your health care provider.  Visit your dentist if you have not gone yet during your pregnancy. Use a soft toothbrush to brush your teeth and be gentle when you floss. Contact a health care provider if:  You have dizziness.  You have mild pelvic cramps, pelvic pressure, or nagging pain in the abdominal area.  You have persistent nausea, vomiting, or diarrhea.  You have a bad smelling vaginal discharge.  You have pain when you urinate. Get help right away if:  You have a  fever.  You are leaking fluid from your vagina.  You have spotting or bleeding from your vagina.  You have severe abdominal cramping or pain.  You have rapid weight gain or weight loss.  You have shortness of breath with chest pain.  You notice sudden or extreme swelling of your face, hands, ankles, feet, or legs.  You have not felt your baby move in over an hour.  You have severe headaches that do not go away when you take medicine.  You have vision changes. Summary  The second trimester is from week 14 through week 27 (months 4 through 6). It is also a time when the fetus is growing rapidly.  Your body goes through many changes during pregnancy. The changes vary from woman to woman.  Avoid all smoking, herbs, alcohol, and unprescribed drugs. These chemicals affect the formation and growth your baby.  Do not use any tobacco products, such as cigarettes, chewing tobacco, and e-cigarettes. If you need help quitting, ask your health care provider.  Contact your health care provider if you have any questions. Keep all prenatal visits as told by your health care provider. This is important. This information is not intended to replace advice given to you by your health care provider. Make sure you discuss any questions you have with your health care provider. Document Released: 04/29/2001 Document Revised: 10/11/2015 Document Reviewed: 07/06/2012 Elsevier Interactive Patient Education  2017 [employer].

## 2017-04-01 NOTE — Addendum Note (Signed)
Addended by: Sherre LainASH, AMANDA A on: 04/01/2017 10:52 AM   Modules accepted: Orders

## 2017-04-03 LAB — INTEGRATED 1
Crown Rump Length: 55.9 mm
Gest. Age on Collection Date: 12.1 weeks
MATERNAL AGE AT EDD: 20.7 a
NUCHAL TRANSLUCENCY (NT): 1 mm
NUMBER OF FETUSES: 1
PAPP-A VALUE: 1848.5 ng/mL
WEIGHT: 145 [lb_av]

## 2017-04-29 ENCOUNTER — Encounter: Payer: Self-pay | Admitting: Women's Health

## 2017-04-29 ENCOUNTER — Ambulatory Visit (INDEPENDENT_AMBULATORY_CARE_PROVIDER_SITE_OTHER): Payer: BLUE CROSS/BLUE SHIELD | Admitting: Women's Health

## 2017-04-29 VITALS — BP 110/60 | HR 99 | Wt 146.6 lb

## 2017-04-29 DIAGNOSIS — Z3400 Encounter for supervision of normal first pregnancy, unspecified trimester: Secondary | ICD-10-CM

## 2017-04-29 DIAGNOSIS — Z3A16 16 weeks gestation of pregnancy: Secondary | ICD-10-CM

## 2017-04-29 DIAGNOSIS — Z1389 Encounter for screening for other disorder: Secondary | ICD-10-CM

## 2017-04-29 DIAGNOSIS — Z1379 Encounter for other screening for genetic and chromosomal anomalies: Secondary | ICD-10-CM

## 2017-04-29 DIAGNOSIS — Z3402 Encounter for supervision of normal first pregnancy, second trimester: Secondary | ICD-10-CM

## 2017-04-29 DIAGNOSIS — Z331 Pregnant state, incidental: Secondary | ICD-10-CM

## 2017-04-29 DIAGNOSIS — Z363 Encounter for antenatal screening for malformations: Secondary | ICD-10-CM

## 2017-04-29 LAB — POCT URINALYSIS DIPSTICK
Blood, UA: NEGATIVE
GLUCOSE UA: NEGATIVE
KETONES UA: NEGATIVE
Leukocytes, UA: NEGATIVE
NITRITE UA: NEGATIVE
PROTEIN UA: NEGATIVE

## 2017-04-29 NOTE — Patient Instructions (Addendum)
Theresa David, I greatly value your feedback.  If you receive a survey following your visit with us today, we appreciate you taking the time to fill it out.  Thanks, Joellyn HaffKim Dilan Novosad, CNM, WHNP-BC  For your lower back pain you may:  Purchase a pregnancy belt from [employer], [employer], [employer], etc and wear it while you are up and about  Take warm baths  Use a heating pad to your lower back for no longer than 20 minutes at a time, and do not place near abdomen  Take tylenol as needed. Please follow directions on the bottle  Kinesthesiology tape (can get from sporting goods store), google how to tape belly for pregnancy   For Headaches:   Stay well hydrated, drink enough water so that your urine is clear, sometimes if you are dehydrated you can get headaches  Eat small frequent meals and snacks, sometimes if you are hungry you can get headaches  Sometimes you get headaches during pregnancy from the pregnancy hormones  You can try tylenol (1-2 regular strength 325mg  or 1-2 extra strength 500mg ) as directed on the box. The least amount of medication that works is best.   Cool compresses (cool wet washcloth or ice pack) to area of head that is hurting  You can also try drinking a caffeinated drink to see if this will help  If not helping, try below:  For Prevention of Headaches/Migraines:  CoQ10 100mg  three times daily  Vitamin B2 400mg  daily  Magnesium Oxide 400-600mg  daily  If You Get a Bad Headache/Migraine:  Benadryl 25mg    Magnesium Oxide  1 large Gatorade  2 extra strength Tylenol (1,000mg  total)  1 cup coffee or Coke  If this doesn't help please call us @ [phone]     Second Trimester of Pregnancy The second trimester is from week 14 through week 27 (months 4 through 6). The second trimester is often a time when you feel your best. Your body has adjusted to being pregnant, and you begin to feel better physically. Usually, morning sickness has  lessened or quit completely, you may have more energy, and you may have an increase in appetite. The second trimester is also a time when the fetus is growing rapidly. At the end of the sixth month, the fetus is about 9 inches long and weighs about 1 pounds. You will likely begin to feel the baby move (quickening) between 16 and 20 [place] of pregnancy. Body changes during your second trimester Your body continues to go through many changes during your second trimester. The changes vary from woman to woman.  Your weight will continue to increase. You will notice your lower abdomen bulging out.  You may begin to get stretch marks on your hips, abdomen, and breasts.  You may develop headaches that can be relieved by medicines. The medicines should be approved by your health care provider.  You may urinate more often because the fetus is pressing on your bladder.  You may develop or continue to have heartburn as a result of your pregnancy.  You may develop constipation because certain hormones are causing the muscles that push waste through your intestines to slow down.  You may develop hemorrhoids or swollen, bulging veins (varicose veins).  You may have back pain. This is caused by: ? Weight gain. ? Pregnancy hormones that are relaxing the joints in your pelvis. ? A shift in weight and the muscles that support your balance.  Your breasts will continue to grow  and they will continue to become tender.  Your gums may bleed and may be sensitive to brushing and flossing.  Dark spots or blotches (chloasma, mask of pregnancy) may develop on your face. This will likely fade after the baby is born.  A dark line from your belly button to the pubic area (linea nigra) may appear. This will likely fade after the baby is born.  You may have changes in your hair. These can include thickening of your hair, rapid growth, and changes in texture. Some women also have hair loss during or after pregnancy, or  hair that feels dry or thin. Your hair will most likely return to normal after your baby is born.  What to expect at prenatal visits During a routine prenatal visit:  You will be weighed to make sure you and the fetus are growing normally.  Your blood pressure will be taken.  Your abdomen will be measured to track your baby's growth.  The fetal heartbeat will be listened to.  Any test results from the previous visit will be discussed.  Your health care provider may ask you:  How you are feeling.  If you are feeling the baby move.  If you have had any abnormal symptoms, such as leaking fluid, bleeding, severe headaches, or abdominal cramping.  If you are using any tobacco products, including cigarettes, chewing tobacco, and electronic cigarettes.  If you have any questions.  Other tests that may be performed during your second trimester include:  Blood tests that check for: ? Low iron levels (anemia). ? High blood sugar that affects pregnant women (gestational diabetes) between 1524 and 28 [place]. ? Rh antibodies. This is to check for a protein on red blood cells (Rh factor).  Urine tests to check for infections, diabetes, or protein in the urine.  An ultrasound to confirm the proper growth and development of the baby.  An amniocentesis to check for possible genetic problems.  Fetal screens for spina bifida and Down syndrome.  HIV (human immunodeficiency virus) testing. Routine prenatal testing includes screening for HIV, unless you choose not to have this test.  Follow these instructions at home: Medicines  Follow your health care provider's instructions regarding medicine use. Specific medicines may be either safe or unsafe to take during pregnancy.  Take a prenatal vitamin that contains at least 600 micrograms (mcg) of folic acid.  If you develop constipation, try taking a stool softener if your health care provider approves. Eating and drinking  Eat a balanced  diet that includes fresh fruits and vegetables, whole grains, good sources of protein such as meat, eggs, or tofu, and low-fat dairy. Your health care provider will help you determine the amount of weight gain that is right for you.  Avoid raw meat and uncooked cheese. These carry germs that can cause birth defects in the baby.  If you have low calcium intake from food, talk to your health care provider about whether you should take a daily calcium supplement.  Limit foods that are high in fat and processed sugars, such as fried and sweet foods.  To prevent constipation: ? Drink enough fluid to keep your urine clear or pale yellow. ? Eat foods that are high in fiber, such as fresh fruits and vegetables, whole grains, and beans. Activity  Exercise only as directed by your health care provider. Most women can continue their usual exercise routine during pregnancy. Try to exercise for 30 minutes at least 5 days a week. Stop exercising if  you experience uterine contractions.  Avoid heavy lifting, wear low heel shoes, and practice good posture.  A sexual relationship may be continued unless your health care provider directs you otherwise. Relieving pain and discomfort  Wear a good support bra to prevent discomfort from breast tenderness.  Take warm sitz baths to soothe any pain or discomfort caused by hemorrhoids. Use hemorrhoid cream if your health care provider approves.  Rest with your legs elevated if you have leg cramps or low back pain.  If you develop varicose veins, wear support hose. Elevate your feet for 15 minutes, 3-4 times a day. Limit salt in your diet. Prenatal Care  Write down your questions. Take them to your prenatal visits.  Keep all your prenatal visits as told by your health care provider. This is important. Safety  Wear your seat belt at all times when driving.  Make a list of emergency phone numbers, including numbers for family, friends, the hospital, and police  and fire departments. General instructions  Ask your health care provider for a referral to a local prenatal education class. Begin classes no later than the beginning of month 6 of your pregnancy.  Ask for help if you have counseling or nutritional needs during pregnancy. Your health care provider can offer advice or refer you to specialists for help with various needs.  Do not use hot tubs, steam rooms, or saunas.  Do not douche or use tampons or scented sanitary pads.  Do not cross your legs for long periods of time.  Avoid cat litter boxes and soil used by cats. These carry germs that can cause birth defects in the baby and possibly loss of the fetus by miscarriage or stillbirth.  Avoid all smoking, herbs, alcohol, and unprescribed drugs. Chemicals in these products can affect the formation and growth of the baby.  Do not use any products that contain nicotine or tobacco, such as cigarettes and e-cigarettes. If you need help quitting, ask your health care provider.  Visit your dentist if you have not gone yet during your pregnancy. Use a soft toothbrush to brush your teeth and be gentle when you floss. Contact a health care provider if:  You have dizziness.  You have mild pelvic cramps, pelvic pressure, or nagging pain in the abdominal area.  You have persistent nausea, vomiting, or diarrhea.  You have a bad smelling vaginal discharge.  You have pain when you urinate. Get help right away if:  You have a fever.  You are leaking fluid from your vagina.  You have spotting or bleeding from your vagina.  You have severe abdominal cramping or pain.  You have rapid weight gain or weight loss.  You have shortness of breath with chest pain.  You notice sudden or extreme swelling of your face, hands, ankles, feet, or legs.  You have not felt your baby move in over an hour.  You have severe headaches that do not go away when you take medicine.  You have vision  changes. Summary  The second trimester is from week 14 through week 27 (months 4 through 6). It is also a time when the fetus is growing rapidly.  Your body goes through many changes during pregnancy. The changes vary from woman to woman.  Avoid all smoking, herbs, alcohol, and unprescribed drugs. These chemicals affect the formation and growth your baby.  Do not use any tobacco products, such as cigarettes, chewing tobacco, and e-cigarettes. If you need help quitting, ask your health care  provider.  Contact your health care provider if you have any questions. Keep all prenatal visits as told by your health care provider. This is important. This information is not intended to replace advice given to you by your health care provider. Make sure you discuss any questions you have with your health care provider. Document Released: 04/29/2001 Document Revised: 10/11/2015 Document Reviewed: 07/06/2012 Elsevier Interactive Patient Education  2017 [employer].

## 2017-04-29 NOTE — Progress Notes (Signed)
   LOW-RISK PREGNANCY VISIT Patient name: Theresa David MRN [id number]  Date of birth: Mar 29, 1996 Chief Complaint:   Routine Prenatal Visit (2 nd IT)  History of Present Illness:   Theresa David is a 21 y.o. 651P0000 female at 4685w1d with an Estimated Date of Delivery: 10/13/17 being seen today for ongoing management of a low-risk pregnancy.  Today she reports low back pain, headaches.  .  .  Movement: Absent. denies leaking of fluid. Review of Systems:   Pertinent items are noted in HPI Denies abnormal vaginal discharge w/ itching/odor/irritation, headaches, visual changes, shortness of breath, chest pain, abdominal pain, severe nausea/vomiting, or problems with urination or bowel movements unless otherwise stated above. Pertinent History Reviewed:  Reviewed past medical,surgical, social, obstetrical and family history.  Reviewed problem list, medications and allergies. Physical Assessment:   Vitals:   04/29/17 1004  BP: 110/60  Pulse: 99  Weight: 146 lb 9.6 oz (66.5 kg)  Body mass index is 24.4 kg/m.        Physical Examination:   General appearance: Well appearing, and in no distress  Mental status: Alert, oriented to person, place, and time  Skin: Warm & dry  Cardiovascular: Normal heart rate noted  Respiratory: Normal respiratory effort, no distress  Abdomen: Soft, gravid, nontender  Pelvic: Cervical exam deferred         Extremities: Edema: None  Fetal Status: Fetal Heart Rate (bpm): 159   Movement: Absent    Results for orders placed or performed in visit on 04/29/17 (from the past 24 hour(s))  POCT urinalysis dipstick   Collection Time: 04/29/17 10:08 AM  Result Value Ref Range   Color, UA     Clarity, UA     Glucose, UA neg    Bilirubin, UA     Ketones, UA neg    Spec Grav, UA  1.010 - 1.025   Blood, UA neg    pH, UA  5.0 - 8.0   Protein, UA neg    Urobilinogen, UA  0.2 or 1.0 E.U./dL   Nitrite, UA neg    Leukocytes, UA Negative Negative   Appearance     Odor      Assessment & Plan:  1) Low-risk pregnancy G1P0000 at 1185w1d with an Estimated Date of Delivery: 10/13/17   2) Low back pain, gave printed prevention/relief measures   3) Headaches, gave printed prevention/relief measures    Labs/procedures today: 2nd IT  Plan:  Continue routine obstetrical care   Reviewed: Preterm labor symptoms and general obstetric precautions including but not limited to vaginal bleeding, contractions, leaking of fluid and fetal movement were reviewed in detail with the patient.  All questions were answered  Follow-up: Return in about 3 [place] (around 05/20/2017) for LROB, WG:NFAOZHYS:Anatomy. (no u/s available in 2wks)  Orders Placed This Encounter  Procedures  . US OB Comp + 14 Wk  . INTEGRATED 2  . POCT urinalysis dipstick   Marge DuncansBooker, Anthany Thornhill Randall CNM, [facility]NP-BC 04/29/2017 10:25 AM

## 2017-05-04 LAB — INTEGRATED 2
AFP MoM: 1.43
Alpha-Fetoprotein: 49.3 ng/mL
Crown Rump Length: 55.9 mm
DIA MoM: 1.1
DIA Value: 190.8 pg/mL
Estriol, Unconjugated: 0.94 ng/mL
Gest. Age on Collection Date: 12.1 wk
Gestational Age: 16.1 wk
Maternal Age at EDD: 20.7 a
Nuchal Translucency (NT): 1 mm
Nuchal Translucency MoM: 0.69
Number of Fetuses: 1
PAPP-A MoM: 2.19
PAPP-A Value: 1848.5 ng/mL
Test Results:: NEGATIVE
Weight: 145 [lb_av]
Weight: 145 [lb_av]
hCG MoM: 0.45
hCG Value: 15.1 [IU]/mL
uE3 MoM: 1.15

## 2017-05-18 ENCOUNTER — Ambulatory Visit (INDEPENDENT_AMBULATORY_CARE_PROVIDER_SITE_OTHER): Payer: BLUE CROSS/BLUE SHIELD

## 2017-05-18 ENCOUNTER — Encounter: Payer: Self-pay | Admitting: Women's Health

## 2017-05-18 ENCOUNTER — Ambulatory Visit (INDEPENDENT_AMBULATORY_CARE_PROVIDER_SITE_OTHER): Payer: BLUE CROSS/BLUE SHIELD | Admitting: Women's Health

## 2017-05-18 ENCOUNTER — Other Ambulatory Visit: Payer: Self-pay

## 2017-05-18 VITALS — BP 124/80 | HR 78 | Wt 150.0 lb

## 2017-05-18 DIAGNOSIS — Z363 Encounter for antenatal screening for malformations: Secondary | ICD-10-CM

## 2017-05-18 DIAGNOSIS — Z3A18 18 weeks gestation of pregnancy: Secondary | ICD-10-CM

## 2017-05-18 DIAGNOSIS — Z3402 Encounter for supervision of normal first pregnancy, second trimester: Secondary | ICD-10-CM

## 2017-05-18 DIAGNOSIS — Z331 Pregnant state, incidental: Secondary | ICD-10-CM

## 2017-05-18 DIAGNOSIS — Z3400 Encounter for supervision of normal first pregnancy, unspecified trimester: Secondary | ICD-10-CM

## 2017-05-18 DIAGNOSIS — Z1389 Encounter for screening for other disorder: Secondary | ICD-10-CM

## 2017-05-18 LAB — POCT URINALYSIS DIPSTICK
Blood, UA: NEGATIVE
Glucose, UA: NEGATIVE
Ketones, UA: NEGATIVE
LEUKOCYTES UA: NEGATIVE
NITRITE UA: NEGATIVE
PROTEIN UA: NEGATIVE

## 2017-05-18 NOTE — Progress Notes (Signed)
US 18+6 wks,cephalic,cx 4 cm,normal ovaries bilat,post pl gr 0,fhr 157 bpm,svp of fluid 5 cm,anatomy complete,no obvious abnormalities

## 2017-05-18 NOTE — Progress Notes (Signed)
   LOW-RISK PREGNANCY VISIT Patient name: Theresa GoslingValencia Quinlivan MRN [id number]  Date of birth: 03/16/1996 Chief Complaint:   Routine Prenatal Visit (u/s today)  History of Present Illness:   Theresa GoslingValencia Paul is a 21 y.o. 421P0000 female at 7472w6d with an Estimated Date of Delivery: 10/13/17 being seen today for ongoing management of a low-risk pregnancy.  Today she reports no complaints.  . Vag. Bleeding: None.  Movement: Absent. denies leaking of fluid. Review of Systems:   Pertinent items are noted in HPI Denies abnormal vaginal discharge w/ itching/odor/irritation, headaches, visual changes, shortness of breath, chest pain, abdominal pain, severe nausea/vomiting, or problems with urination or bowel movements unless otherwise stated above. Pertinent History Reviewed:  Reviewed past medical,surgical, social, obstetrical and family history.  Reviewed problem list, medications and allergies. Physical Assessment:   Vitals:   05/18/17 1215  BP: 124/80  Pulse: 78  Weight: 150 lb (68 kg)  Body mass index is 24.96 kg/m.        Physical Examination:   General appearance: Well appearing, and in no distress  Mental status: Alert, oriented to person, place, and time  Skin: Warm & dry  Cardiovascular: Normal heart rate noted  Respiratory: Normal respiratory effort, no distress  Abdomen: Soft, gravid, nontender  Pelvic: Cervical exam deferred         Extremities: Edema: None  Fetal Status: Fetal Heart Rate (bpm): 157 u/s   Movement: Absent    US 18+6 wks,cephalic,cx 4 cm,normal ovaries bilat,post pl gr 0,fhr 157 bpm,svp of fluid 5 cm,anatomy complete,no obvious abnormalities   Results for orders placed or performed in visit on 05/18/17 (from the past 24 hour(s))  POCT urinalysis dipstick   Collection Time: 05/18/17 12:16 PM  Result Value Ref Range   Color, UA     Clarity, UA     Glucose, UA neg    Bilirubin, UA     Ketones, UA neg    Spec Grav, UA  1.010 - 1.025   Blood, UA neg    pH, UA  5.0  - 8.0   Protein, UA neg    Urobilinogen, UA  0.2 or 1.0 E.U./dL   Nitrite, UA neg    Leukocytes, UA Negative Negative   Appearance     Odor      Assessment & Plan:  1) Low-risk pregnancy G1P0000 at 8172w6d with an Estimated Date of Delivery: 10/13/17    Labs/procedures today: anatomy u/s  Plan:  Continue routine obstetrical care   Reviewed: Preterm labor symptoms and general obstetric precautions including but not limited to vaginal bleeding, contractions, leaking of fluid and fetal movement were reviewed in detail with the patient.  All questions were answered  Follow-up: Return in about 4 [place] (around 06/15/2017) for LROB.  Orders Placed This Encounter  Procedures  . POCT urinalysis dipstick   Marge DuncansBooker, Neeya Prigmore Randall CNM, [facility] Services[facility]NP-BC 05/18/2017 12:35 PM

## 2017-05-18 NOTE — Patient Instructions (Signed)
Theresa David, I greatly value your feedback.  If you receive a survey following your visit with us today, we appreciate you taking the time to fill it out.  Thanks, Theresa David, CNM, WHNP-BC   Second Trimester of Pregnancy The second trimester is from week 14 through week 27 (months 4 through 6). The second trimester is often a time when you feel your best. Your body has adjusted to being pregnant, and you begin to feel better physically. Usually, morning sickness has lessened or quit completely, you may have more energy, and you may have an increase in appetite. The second trimester is also a time when the fetus is growing rapidly. At the end of the sixth month, the fetus is about 9 inches long and weighs about 1 pounds. You will likely begin to feel the baby move (quickening) between 16 and 20 Theresa David of pregnancy. Body changes during your second trimester Your body continues to go through many changes during your second trimester. The changes vary from woman to woman.  Your weight will continue to increase. You will notice your lower abdomen bulging out.  You may begin to get stretch marks on your hips, abdomen, and breasts.  You may develop headaches that can be relieved by medicines. The medicines should be approved by your health care provider.  You may urinate more often because the fetus is pressing on your bladder.  You may develop or continue to have heartburn as a result of your pregnancy.  You may develop constipation because certain hormones are causing the muscles that push waste through your intestines to slow down.  You may develop hemorrhoids or swollen, bulging veins (varicose veins).  You may have back pain. This is caused by: ? Weight gain. ? Pregnancy hormones that are relaxing the joints in your pelvis. ? A shift in weight and the muscles that support your balance.  Your breasts will continue to grow and they will continue to become tender.  Your gums may bleed and  may be sensitive to brushing and flossing.  Dark spots or blotches (chloasma, mask of pregnancy) may develop on your face. This will likely fade after the baby is born.  A dark line from your belly button to the pubic area (linea nigra) may appear. This will likely fade after the baby is born.  You may have changes in your hair. These can include thickening of your hair, rapid growth, and changes in texture. Some women also have hair loss during or after pregnancy, or hair that feels dry or thin. Your hair will most likely return to normal after your baby is born.  What to expect at prenatal visits During a routine prenatal visit:  You will be weighed to make sure you and the fetus are growing normally.  Your blood pressure will be taken.  Your abdomen will be measured to track your baby's growth.  The fetal heartbeat will be listened to.  Any test results from the previous visit will be discussed.  Your health care provider may ask you:  How you are feeling.  If you are feeling the baby move.  If you have had any abnormal symptoms, such as leaking fluid, bleeding, severe headaches, or abdominal cramping.  If you are using any tobacco products, including cigarettes, chewing tobacco, and electronic cigarettes.  If you have any questions.  Other tests that may be performed during your second trimester include:  Blood tests that check for: ? Low iron levels (anemia). ? High blood   sugar that affects pregnant women (gestational diabetes) between 55 and 28 Theresa David. ? Rh antibodies. This is to check for a protein on red blood cells (Rh factor).  Urine tests to check for infections, diabetes, or protein in the urine.  An ultrasound to confirm the proper growth and development of the baby.  An amniocentesis to check for possible genetic problems.  Fetal screens for spina bifida and Down syndrome.  HIV (human immunodeficiency virus) testing. Routine prenatal testing includes  screening for HIV, unless you choose not to have this test.  Follow these instructions at home: Medicines  Follow your health care provider's instructions regarding medicine use. Specific medicines may be either safe or unsafe to take during pregnancy.  Take a prenatal vitamin that contains at least 600 micrograms (mcg) of folic acid.  If you develop constipation, try taking a stool softener if your health care provider approves. Eating and drinking  Eat a balanced diet that includes fresh fruits and vegetables, whole grains, good sources of protein such as meat, eggs, or tofu, and low-fat dairy. Your health care provider will help you determine the amount of weight gain that is right for you.  Avoid raw meat and uncooked cheese. These carry germs that can cause birth defects in the baby.  If you have low calcium intake from food, talk to your health care provider about whether you should take a daily calcium supplement.  Limit foods that are high in fat and processed sugars, such as fried and sweet foods.  To prevent constipation: ? Drink enough fluid to keep your urine clear or pale yellow. ? Eat foods that are high in fiber, such as fresh fruits and vegetables, whole grains, and beans. Activity  Exercise only as directed by your health care provider. Most women can continue their usual exercise routine during pregnancy. Try to exercise for 30 minutes at least 5 days a week. Stop exercising if you experience uterine contractions.  Avoid heavy lifting, wear low heel shoes, and practice good posture.  A sexual relationship may be continued unless your health care provider directs you otherwise. Relieving pain and discomfort  Wear a good support bra to prevent discomfort from breast tenderness.  Take warm sitz baths to soothe any pain or discomfort caused by hemorrhoids. Use hemorrhoid cream if your health care provider approves.  Rest with your legs elevated if you have leg cramps  or low back pain.  If you develop varicose veins, wear support hose. Elevate your feet for 15 minutes, 3-4 times a day. Limit salt in your diet. Prenatal Care  Write down your questions. Take them to your prenatal visits.  Keep all your prenatal visits as told by your health care provider. This is important. Safety  Wear your seat belt at all times when driving.  Make a list of emergency phone numbers, including numbers for family, friends, the hospital, and police and fire departments. General instructions  Ask your health care provider for a referral to a local prenatal education class. Begin classes no later than the beginning of month 6 of your pregnancy.  Ask for help if you have counseling or nutritional needs during pregnancy. Your health care provider can offer advice or refer you to specialists for help with various needs.  Do not use hot tubs, steam rooms, or saunas.  Do not douche or use tampons or scented sanitary pads.  Do not cross your legs for long periods of time.  Avoid cat litter boxes and soil used  by cats. These carry germs that can cause birth defects in the baby and possibly loss of the fetus by miscarriage or stillbirth.  Avoid all smoking, herbs, alcohol, and unprescribed drugs. Chemicals in these products can affect the formation and growth of the baby.  Do not use any products that contain nicotine or tobacco, such as cigarettes and e-cigarettes. If you need help quitting, ask your health care provider.  Visit your dentist if you have not gone yet during your pregnancy. Use a soft toothbrush to brush your teeth and be gentle when you floss. Contact a health care provider if:  You have dizziness.  You have mild pelvic cramps, pelvic pressure, or nagging pain in the abdominal area.  You have persistent nausea, vomiting, or diarrhea.  You have a bad smelling vaginal discharge.  You have pain when you urinate. Get help right away if:  You have a  fever.  You are leaking fluid from your vagina.  You have spotting or bleeding from your vagina.  You have severe abdominal cramping or pain.  You have rapid weight gain or weight loss.  You have shortness of breath with chest pain.  You notice sudden or extreme swelling of your face, hands, ankles, feet, or legs.  You have not felt your baby move in over an hour.  You have severe headaches that do not go away when you take medicine.  You have vision changes. Summary  The second trimester is from week 14 through week 27 (months 4 through 6). It is also a time when the fetus is growing rapidly.  Your body goes through many changes during pregnancy. The changes vary from woman to woman.  Avoid all smoking, herbs, alcohol, and unprescribed drugs. These chemicals affect the formation and growth your baby.  Do not use any tobacco products, such as cigarettes, chewing tobacco, and e-cigarettes. If you need help quitting, ask your health care provider.  Contact your health care provider if you have any questions. Keep all prenatal visits as told by your health care provider. This is important. This information is not intended to replace advice given to you by your health care provider. Make sure you discuss any questions you have with your health care provider. Document Released: 04/29/2001 Document Revised: 10/11/2015 Document Reviewed: 07/06/2012 Elsevier Interactive Patient Education  2017 [employer].

## 2017-05-19 NOTE — L&D Delivery Note (Signed)
Delivery Note At 4:14 PM a viable female was delivered via Vaginal, Spontaneous (Presentation: OA  ).  APGAR: 8, 9; weight 5 lb 12.1 oz (2610 g).   Placenta status: intact .  Cord: 3 vessels, with the following complications: none.  Cord pH: n/a  Anesthesia:  None Episiotomy: None Lacerations: 1st degree;Periurethral;Vaginal Suture Repair: 3.0 vicryl Est. Blood Loss (mL): 150  Mom to postpartum.  Baby to Couplet care / Skin to Skin.  Felisa Bonier, MD, PGY-1 - Family Medicine - [place] 09/23/2017, 6:13 PM

## 2017-06-03 ENCOUNTER — Telehealth: Payer: Self-pay | Admitting: Obstetrics & Gynecology

## 2017-06-03 ENCOUNTER — Encounter: Payer: Self-pay | Admitting: *Deleted

## 2017-06-03 NOTE — Telephone Encounter (Signed)
Patient notified letter was ready.

## 2017-06-15 ENCOUNTER — Encounter: Payer: BLUE CROSS/BLUE SHIELD | Admitting: Women's Health

## 2017-06-19 ENCOUNTER — Ambulatory Visit (INDEPENDENT_AMBULATORY_CARE_PROVIDER_SITE_OTHER): Payer: Medicaid Other | Admitting: Women's Health

## 2017-06-19 ENCOUNTER — Encounter: Payer: Self-pay | Admitting: Women's Health

## 2017-06-19 VITALS — BP 112/80 | HR 99 | Wt 164.0 lb

## 2017-06-19 DIAGNOSIS — O99019 Anemia complicating pregnancy, unspecified trimester: Secondary | ICD-10-CM | POA: Insufficient documentation

## 2017-06-19 DIAGNOSIS — K219 Gastro-esophageal reflux disease without esophagitis: Secondary | ICD-10-CM

## 2017-06-19 DIAGNOSIS — O26892 Other specified pregnancy related conditions, second trimester: Secondary | ICD-10-CM

## 2017-06-19 DIAGNOSIS — D649 Anemia, unspecified: Secondary | ICD-10-CM | POA: Diagnosis not present

## 2017-06-19 DIAGNOSIS — Z331 Pregnant state, incidental: Secondary | ICD-10-CM

## 2017-06-19 DIAGNOSIS — O99012 Anemia complicating pregnancy, second trimester: Secondary | ICD-10-CM | POA: Diagnosis not present

## 2017-06-19 DIAGNOSIS — Z3402 Encounter for supervision of normal first pregnancy, second trimester: Secondary | ICD-10-CM

## 2017-06-19 DIAGNOSIS — Z3A23 23 weeks gestation of pregnancy: Secondary | ICD-10-CM

## 2017-06-19 DIAGNOSIS — R42 Dizziness and giddiness: Secondary | ICD-10-CM

## 2017-06-19 DIAGNOSIS — Z3400 Encounter for supervision of normal first pregnancy, unspecified trimester: Secondary | ICD-10-CM

## 2017-06-19 DIAGNOSIS — Z1389 Encounter for screening for other disorder: Secondary | ICD-10-CM

## 2017-06-19 LAB — POCT URINALYSIS DIPSTICK
Blood, UA: NEGATIVE
Glucose, UA: NEGATIVE
Ketones, UA: NEGATIVE
Leukocytes, UA: NEGATIVE
Nitrite, UA: NEGATIVE
Protein, UA: NEGATIVE

## 2017-06-19 LAB — POCT HEMOGLOBIN: Hemoglobin: 9.5 g/dL — AB (ref 12.2–16.2)

## 2017-06-19 MED ORDER — FERROUS SULFATE 325 (65 FE) MG PO TABS: 325.0000 mg | ORAL_TABLET | Freq: Two times a day (BID) | ORAL | 3 refills | Status: DC

## 2017-06-19 NOTE — Progress Notes (Signed)
LOW-RISK PREGNANCY VISIT Patient name: Theresa David MRN [id number]  Date of birth: 05-14-1996 Chief Complaint:   Routine Prenatal Visit  History of Present Illness:   Theresa David is a 22 y.o. G62P0000 female at [redacted]w[redacted]d with an Estimated Date of Delivery: 10/13/17 being seen today for ongoing management of a low-risk pregnancy.  Today she reports feels her hgb may be low, was standing the other day and felt like she was going to pass out. Bad reflux- not taking anything.  .  .  Movement: Present. denies leaking of fluid. Review of Systems:   Pertinent items are noted in HPI Denies abnormal vaginal discharge w/ itching/odor/irritation, headaches, visual changes, shortness of breath, chest pain, abdominal pain, severe nausea/vomiting, or problems with urination or bowel movements unless otherwise stated above. Pertinent History Reviewed:  Reviewed past medical,surgical, social, obstetrical and family history.  Reviewed problem list, medications and allergies. Physical Assessment:   Vitals:   06/19/17 1228  BP: 112/80  Pulse: 99  Weight: 164 lb (74.4 kg)  Body mass index is 27.29 kg/m.        Physical Examination:   General appearance: Well appearing, and in no distress  Mental status: Alert, oriented to person, place, and time  Skin: Warm & dry  Cardiovascular: Normal heart rate noted  Respiratory: Normal respiratory effort, no distress  Abdomen: Soft, gravid, nontender  Pelvic: Cervical exam deferred         Extremities: Edema: None  Fetal Status: Fetal Heart Rate (bpm): 153 Fundal Height: 24 cm Movement: Present    Results for orders placed or performed in visit on 06/19/17 (from the past 24 hour(s))  POCT urinalysis dipstick   Collection Time: 06/19/17 12:33 PM  Result Value Ref Range   Color, UA     Clarity, UA     Glucose, UA neg    Bilirubin, UA     Ketones, UA neg    Spec Grav, UA  1.010 - 1.025   Blood, UA neg    pH, UA  5.0 - 8.0   Protein, UA neg    Urobilinogen, UA  0.2 or 1.0 E.U./dL   Nitrite, UA neg    Leukocytes, UA Negative Negative   Appearance     Odor    POCT hemoglobin   Collection Time: 06/19/17  1:19 PM  Result Value Ref Range   Hemoglobin 9.5 (A) 12.2 - 16.2 g/dL    Assessment & Plan:  1) Low-risk pregnancy G1P0000 at [redacted]w[redacted]d with an Estimated Date of Delivery: 10/13/17   2) Reflux, try TUMS, if doesn't help, let us know  3) Anemia> fingerstick Hgb today 9.5, rx fe bid, increase fe-rich foods  4) Dizzy spell> gave printed prevention/relief measures    Meds:  Meds ordered this encounter  Medications  . ferrous sulfate 325 (65 FE) MG tablet    Sig: Take 1 tablet (325 mg total) by mouth 2 (two) times daily with a meal.    Dispense:  60 tablet    Refill:  3    Order Specific Question:   Supervising Provider    Answer:   Lazaro Arms [[id number]]   Labs/procedures today: fingerstick hgb  Plan:  Continue routine obstetrical care   Reviewed: Preterm labor symptoms and general obstetric precautions including but not limited to vaginal bleeding, contractions, leaking of fluid and fetal movement were reviewed in detail with the patient.  All questions were answered  Follow-up: Return in about 4 [place] (around 07/17/2017) for LROB, PN2.  Orders Placed This Encounter  Procedures  . POCT urinalysis dipstick  . POCT hemoglobin   Marge DuncansBooker, Sergi Gellner Randall CNM, [facility] CenterWHNP-BC 06/19/2017 1:38 PM

## 2017-06-19 NOTE — Patient Instructions (Addendum)
Roseanne Reno, I greatly value your feedback.  If you receive a survey following your visit with [place] today, we appreciate you taking the time to fill it out.  Thanks, Knute Neu, CNM, WHNP-BC   You will have your sugar test next visit.  Please do not eat or drink anything after midnight the night before you come, not even water.  You will be here for at least two hours.     Call the office [phone]) or go to [facility] if:  You begin to have strong, frequent contractions  Your water breaks.  Sometimes it is a big gush of fluid, sometimes it is just a trickle that keeps getting your panties wet or running down your legs  You have vaginal bleeding.  It is normal to have a small amount of spotting if your cervix was checked.   You don't feel your baby moving like normal.  If you don't, get you something to eat and drink and lay down and focus on feeling your baby move.   If your baby is still not moving like normal, you should call the office or go to [facility]:   This is usually related to either your blood sugar or your blood pressure dropping  Make sure you are staying well hydrated and drinking enough water so that your urine is clear  Eat small frequent meals and snacks containing protein (meat, eggs, nuts, cheese) so that your blood sugar doesn't drop  If you do get dizzy, sit/lay down and get you something to drink and a snack containing protein- you will usually start feeling better in 10-20 minutes     Second Trimester of Pregnancy The second trimester is from week 13 through week 28, months 4 through 6. The second trimester is often a time when you feel your best. Your body has also adjusted to being pregnant, and you begin to feel better physically. Usually, morning sickness has lessened or quit completely, you may have more energy, and you may have an increase in appetite. The second trimester is also a time when the fetus is growing rapidly. At  the end of the sixth month, the fetus is about 9 inches long and weighs about 1 pounds. You will likely begin to feel the baby move (quickening) between 18 and 20 Tena of the pregnancy. BODY CHANGES Your body goes through many changes during pregnancy. The changes vary from woman to woman.   Your weight will continue to increase. You will notice your lower abdomen bulging out.  You may begin to get stretch marks on your hips, abdomen, and breasts.  You may develop headaches that can be relieved by medicines approved by your health care provider.  You may urinate more often because the fetus is pressing on your bladder.  You may develop or continue to have heartburn as a result of your pregnancy.  You may develop constipation because certain hormones are causing the muscles that push waste through your intestines to slow down.  You may develop hemorrhoids or swollen, bulging veins (varicose veins).  You may have back pain because of the weight gain and pregnancy hormones relaxing your joints between the bones in your pelvis and as a result of a shift in weight and the muscles that support your balance.  Your breasts will continue to grow and be tender.  Your gums may bleed and may be sensitive to brushing and flossing.  Dark spots or blotches (chloasma, mask of  pregnancy) may develop on your face. This will likely fade after the baby is born.  A dark line from your belly button to the pubic area (linea nigra) may appear. This will likely fade after the baby is born.  You may have changes in your hair. These can include thickening of your hair, rapid growth, and changes in texture. Some women also have hair loss during or after pregnancy, or hair that feels dry or thin. Your hair will most likely return to normal after your baby is born. WHAT TO EXPECT AT YOUR PRENATAL VISITS During a routine prenatal visit:  You will be weighed to make sure you and the fetus are growing  normally.  Your blood pressure will be taken.  Your abdomen will be measured to track your baby's growth.  The fetal heartbeat will be listened to.  Any test results from the previous visit will be discussed. Your health care provider may ask you:  How you are feeling.  If you are feeling the baby move.  If you have had any abnormal symptoms, such as leaking fluid, bleeding, severe headaches, or abdominal cramping.  If you have any questions. Other tests that may be performed during your second trimester include:  Blood tests that check for:  Low iron levels (anemia).  Gestational diabetes (between 24 and 28 Borre).  Rh antibodies.  Urine tests to check for infections, diabetes, or protein in the urine.  An ultrasound to confirm the proper growth and development of the baby.  An amniocentesis to check for possible genetic problems.  Fetal screens for spina bifida and Down syndrome. HOME CARE INSTRUCTIONS   Avoid all smoking, herbs, alcohol, and unprescribed drugs. These chemicals affect the formation and growth of the baby.  Follow your health care provider's instructions regarding medicine use. There are medicines that are either safe or unsafe to take during pregnancy.  Exercise only as directed by your health care provider. Experiencing uterine cramps is a good sign to stop exercising.  Continue to eat regular, healthy meals.  Wear a good support bra for breast tenderness.  Do not use hot tubs, steam rooms, or saunas.  Wear your seat belt at all times when driving.  Avoid raw meat, uncooked cheese, cat litter boxes, and soil used by cats. These carry germs that can cause birth defects in the baby.  Take your prenatal vitamins.  Try taking a stool softener (if your health care provider approves) if you develop constipation. Eat more high-fiber foods, such as fresh vegetables or fruit and whole grains. Drink plenty of fluids to keep your urine clear or pale  yellow.  Take warm sitz baths to soothe any pain or discomfort caused by hemorrhoids. Use hemorrhoid cream if your health care provider approves.  If you develop varicose veins, wear support hose. Elevate your feet for 15 minutes, 3-4 times a day. Limit salt in your diet.  Avoid heavy lifting, wear low heel shoes, and practice good posture.  Rest with your legs elevated if you have leg cramps or low back pain.  Visit your dentist if you have not gone yet during your pregnancy. Use a soft toothbrush to brush your teeth and be gentle when you floss.  A sexual relationship may be continued unless your health care provider directs you otherwise.  Continue to go to all your prenatal visits as directed by your health care provider. SEEK MEDICAL CARE IF:   You have dizziness.  You have mild pelvic cramps, pelvic pressure,  or nagging pain in the abdominal area.  You have persistent nausea, vomiting, or diarrhea.  You have a bad smelling vaginal discharge.  You have pain with urination. SEEK IMMEDIATE MEDICAL CARE IF:   You have a fever.  You are leaking fluid from your vagina.  You have spotting or bleeding from your vagina.  You have severe abdominal cramping or pain.  You have rapid weight gain or loss.  You have shortness of breath with chest pain.  You notice sudden or extreme swelling of your face, hands, ankles, feet, or legs.  You have not felt your baby move in over an hour.  You have severe headaches that do not go away with medicine.  You have vision changes. Document Released: 04/29/2001 Document Revised: 05/10/2013 Document Reviewed: 07/06/2012 [facility] Patient Information 2015 [place], [place]. This information is not intended to replace advice given to you by your health care provider. Make sure you discuss any questions you have with your health care provider.   Heartburn During Pregnancy Heartburn is a type of pain or discomfort that can happen in the throat  or chest. It is often described as a burning sensation. Heartburn is common during pregnancy because:  A hormone (progesterone) that is released during pregnancy may relax the valve (lower esophageal sphincter, or LES) that separates the esophagus from the stomach. This allows stomach acid to move up into the esophagus, causing heartburn.  The uterus gets larger and pushes up on the stomach, which pushes more acid into the esophagus. This is especially true in the later stages of pregnancy.  Heartburn usually goes away or gets better after giving birth. What are the causes? Heartburn is caused by stomach acid backing up into the esophagus (reflux). Reflux can be triggered by:  Changing hormone levels.  Large meals.  Certain foods and beverages, such as coffee, chocolate, onions, and peppermint.  Exercise.  Increased stomach acid production.  What increases the risk? You are more likely to experience heartburn during pregnancy if you:  Had heartburn prior to becoming pregnant.  Have been pregnant more than once before.  Are overweight or obese.  The likelihood that you will get heartburn also increases as you get farther along in your pregnancy, especially during the last trimester. What are the signs or symptoms? Symptoms of this condition include:  Burning pain in the chest or lower throat.  Bitter taste in the mouth.  Coughing.  Problems swallowing.  Vomiting.  Hoarse voice.  Asthma.  Symptoms may get worse when you lie down or bend over. Symptoms are often worse at night. How is this diagnosed? This condition is diagnosed based on:  Your medical history.  Your symptoms.  Blood tests to check for a certain type of bacteria associated with heartburn.  Whether taking heartburn medicine relieves your symptoms.  Examination of the stomach and esophagus using a tube with a light and camera on the end (endoscopy).  How is this treated? Treatment varies  depending on how severe your symptoms are. Your health care provider may recommend:  Over-the-counter medicines (antacids or acid reducers) for mild heartburn.  Prescription medicines to decrease stomach acid or to protect your stomach lining.  Certain changes in your diet.  Raising the head of your bed so it is higher than the foot of the bed. This helps prevent stomach acid from backing up into the esophagus when you are lying down.  Follow these instructions at home: Eating and drinking  Do not drink alcohol during  your pregnancy.  Identify foods and beverages that make your symptoms worse, and avoid them.  Beverages that you may want to avoid include: ? Coffee and tea (with or without caffeine). ? Energy drinks and sports drinks. ? Carbonated drinks or sodas. ? Citrus fruit juices.  Foods that you may want to avoid include: ? Chocolate and cocoa. ? Peppermint and mint flavorings. ? Garlic, onions, and horseradish. ? Spicy and acidic foods, including peppers, chili powder, curry powder, vinegar, hot sauces, and barbecue sauce. ? Citrus fruits, such as oranges, lemons, and limes. ? Tomato-based foods, such as red sauce, chili, and salsa. ? Fried and fatty foods, such as donuts, french fries, potato chips, and high-fat dressings. ? High-fat meats, such as hot dogs, cold cuts, sausage, ham, and bacon. ? High-fat dairy items, such as whole milk, butter, and cheese.  Eat small, frequent meals instead of large meals.  Avoid drinking large amounts of liquid with your meals.  Avoid eating meals during the 2-3 hours before bedtime.  Avoid lying down right after you eat.  Do not exercise right after you eat. Medicines  Take over-the-counter and prescription medicines only as told by your health care provider.  Do not take aspirin, ibuprofen, or other NSAIDs unless your health care provider tells you to do that.  You may be instructed to avoid medicines that contain sodium  bicarbonate. General instructions  If directed, raise the head of your bed about 6 inches (15 cm) by putting blocks under the legs. Sleeping with more pillows does not effectively relieve heartburn because it only changes the position of your head.  Do not use any products that contain nicotine or tobacco, such as cigarettes and e-cigarettes. If you need help quitting, ask your health care provider.  Wear loose-fitting clothing.  Try to reduce your stress, such as with yoga or meditation. If you need help managing stress, ask your health care provider.  Maintain a healthy weight. If you are overweight, work with your health care provider to safely lose weight.  Keep all follow-up visits as told by your health care provider. This is important. Contact a health care provider if:  You develop new symptoms.  Your symptoms do not improve with treatment.  You have unexplained weight loss.  You have difficulty swallowing.  You make loud sounds when you breathe (wheeze).  You have a cough that does not go away.  You have frequent heartburn for more than 2 [place].  You have nausea or vomiting that does not get better with treatment.  You have pain in your abdomen. Get help right away if:  You have severe chest pain that spreads to your arm, neck, or jaw.  You feel sweaty, dizzy, or light-headed.  You have shortness of breath.  You have pain when swallowing.  You vomit, and your vomit looks like blood or coffee grounds.  Your stool is bloody or black. This information is not intended to replace advice given to you by your health care provider. Make sure you discuss any questions you have with your health care provider. Document Released: 05/02/2000 Document Revised: 01/21/2016 Document Reviewed: 01/21/2016 Elsevier Interactive Patient Education  2018 [employer].   Iron-Rich Diet Iron is a mineral that helps your body to produce hemoglobin. Hemoglobin is a protein in your  red blood cells that carries oxygen to your body's tissues. Eating too little iron may cause you to feel weak and tired, and it can increase your risk for infection. Eating enough  iron is necessary for your body's metabolism, muscle function, and nervous system. Iron is naturally found in many foods. It can also be added to foods or fortified in foods. There are two types of dietary iron:  Heme iron. Heme iron is absorbed by the body more easily than nonheme iron. Heme iron is found in meat, poultry, and fish.  Nonheme iron. Nonheme iron is found in dietary supplements, iron-fortified grains, beans, and vegetables.  You may need to follow an iron-rich diet if:  You have been diagnosed with iron deficiency or iron-deficiency anemia.  You have a condition that prevents you from absorbing dietary iron, such as: ? Infection in your intestines. ? Celiac disease. This involves long-lasting (chronic) inflammation of your intestines.  You do not eat enough iron.  You eat a diet that is high in foods that impair iron absorption.  You have lost a lot of blood.  You have heavy bleeding during your menstrual cycle.  You are pregnant.  What is my plan? Your health care provider may help you to determine how much iron you need per day based on your condition. Generally, when a person consumes sufficient amounts of iron in the diet, the following iron needs are met:  Men. ? 42-64 years old: 11 mg per day. ? 36-50 years old: 8 mg per day.  Women. ? 38-59 years old: 15 mg per day. ? 35-33 years old: 18 mg per day. ? Over 81 years old: 8 mg per day. ? Pregnant women: 27 mg per day. ? Breastfeeding women: 9 mg per day.  What do I need to know about an iron-rich diet?  Eat fresh fruits and vegetables that are high in vitamin C along with foods that are high in iron. This will help increase the amount of iron that your body absorbs from food, especially with foods containing nonheme iron. Foods  that are high in vitamin C include oranges, peppers, tomatoes, and mango.  Take iron supplements only as directed by your health care provider. Overdose of iron can be life-threatening. If you were prescribed iron supplements, take them with orange juice or a vitamin C supplement.  Cook foods in pots and pans that are made from iron.  Eat nonheme iron-containing foods alongside foods that are high in heme iron. This helps to improve your iron absorption.  Certain foods and drinks contain compounds that impair iron absorption. Avoid eating these foods in the same meal as iron-rich foods or with iron supplements. These include: ? Coffee, black tea, and red wine. ? Milk, dairy products, and foods that are high in calcium. ? Beans, soybeans, and peas. ? Whole grains.  When eating foods that contain both nonheme iron and compounds that impair iron absorption, follow these tips to absorb iron better. ? Soak beans overnight before cooking. ? Soak whole grains overnight and drain them before using. ? Ferment flours before baking, such as using yeast in bread dough. What foods can I eat? Grains Iron-fortified breakfast cereal. Iron-fortified whole-wheat bread. Enriched rice. Sprouted grains. Vegetables Spinach. Potatoes with skin. Green peas. Broccoli. Red and green bell peppers. Fermented vegetables. Fruits Prunes. Raisins. Oranges. Strawberries. Mango. Grapefruit. Meats and Other Protein Sources Beef liver. Oysters. Beef. Shrimp. [place]. Chicken. [place]. Sardines. Chickpeas. Nuts. Tofu. Beverages Tomato juice. Fresh orange juice. Prune juice. Hibiscus tea. Fortified instant breakfast shakes. Condiments Tahini. Fermented soy sauce. Sweets and Desserts Black-strap molasses. Other Wheat germ. The items listed above may not be a complete list of recommended  foods or beverages. Contact your dietitian for more options. What foods are not recommended? Grains Whole grains. Bran cereal. Bran  flour. Oats. Vegetables Artichokes. Brussels sprouts. Kale. Fruits Blueberries. Raspberries. Strawberries. Figs. Meats and Other Protein Sources Soybeans. Products made from soy protein. Dairy Milk. Cream. Cheese. Yogurt. Cottage cheese. Beverages Coffee. Black tea. Red wine. Sweets and Desserts Cocoa. Chocolate. Ice cream. Other Basil. Oregano. Parsley. The items listed above may not be a complete list of foods and beverages to avoid. Contact your dietitian for more information. This information is not intended to replace advice given to you by your health care provider. Make sure you discuss any questions you have with your health care provider. Document Released: 12/17/2004 Document Revised: 11/23/2015 Document Reviewed: 11/30/2013 Elsevier Interactive Patient Education  [employer].

## 2017-07-17 ENCOUNTER — Ambulatory Visit (INDEPENDENT_AMBULATORY_CARE_PROVIDER_SITE_OTHER): Payer: Medicaid Other | Admitting: Obstetrics and Gynecology

## 2017-07-17 ENCOUNTER — Other Ambulatory Visit: Payer: Medicaid Other

## 2017-07-17 ENCOUNTER — Encounter: Payer: Self-pay | Admitting: Obstetrics and Gynecology

## 2017-07-17 VITALS — BP 110/80 | HR 99 | Wt 165.8 lb

## 2017-07-17 DIAGNOSIS — Z3402 Encounter for supervision of normal first pregnancy, second trimester: Secondary | ICD-10-CM

## 2017-07-17 DIAGNOSIS — Z3A27 27 weeks gestation of pregnancy: Secondary | ICD-10-CM

## 2017-07-17 DIAGNOSIS — Z1389 Encounter for screening for other disorder: Secondary | ICD-10-CM

## 2017-07-17 DIAGNOSIS — Z331 Pregnant state, incidental: Secondary | ICD-10-CM

## 2017-07-17 DIAGNOSIS — Z131 Encounter for screening for diabetes mellitus: Secondary | ICD-10-CM

## 2017-07-17 LAB — POCT URINALYSIS DIPSTICK
Glucose, UA: NEGATIVE
Ketones, UA: NEGATIVE
LEUKOCYTES UA: NEGATIVE
NITRITE UA: NEGATIVE
PROTEIN UA: NEGATIVE
RBC UA: NEGATIVE

## 2017-07-17 NOTE — Patient Instructions (Signed)
(  [phone] is the phone number for Pregnancy Classes or hospital tours at [facility].  ° °You will be referred to  [email or web address]   for more information on childbirth classes   °At this site you may register for classes. You may sign up for a waiting list if classes are full. Please SIGN UP FOR THIS!.   When the waiting list becomes long, sometimes new classes can be added. ° ° ° °

## 2017-07-17 NOTE — Progress Notes (Signed)
Patient ID: Theresa David, female   DOB: [birth year], 22 y.o.   MRN: [id number]   LOW-RISK PREGNANCY VISIT Patient name: Theresa David MRN [id number]  Date of birth: [birth year] Chief Complaint:   Routine Prenatal Visit (PN2)  History of Present Illness:   Theresa David is a 22 y.o. 481P0000 female at 3163w3d with an Estimated Date of Delivery: 10/13/17 being seen today for ongoing management of a low-risk pregnancy.  Today she reports no complaints.  .  .  Movement: Present. denies leaking of fluid. Review of Systems:   Pertinent items are noted in HPI Denies abnormal vaginal discharge w/ itching/odor/irritation, headaches, visual changes, shortness of breath, chest pain, abdominal pain, severe nausea/vomiting, or problems with urination or bowel movements unless otherwise stated above. Pertinent History Reviewed:  Reviewed past medical,surgical, social, obstetrical and family history.  Reviewed problem list, medications and allergies. Physical Assessment:   Vitals:   07/17/17 0944  BP: 110/80  Pulse: 99  Weight: 165 lb 12.8 oz (75.2 kg)  Body mass index is 27.59 kg/m.        Physical Examination:   General appearance: Well appearing, and in no distress  Mental status: Alert, oriented to person, place, and time  Skin: Warm & dry  Cardiovascular: Normal heart rate noted  Respiratory: Normal respiratory effort, no distress  Abdomen: Soft, gravid, nontender  Pelvic: Cervical exam deferred         Extremities: Edema: None  Fetal Status: Fetal Heart Rate (bpm): 135 Fundal Height: 27 cm Movement: Present    Results for orders placed or performed in visit on 07/17/17 (from the past 24 hour(s))  POCT urinalysis dipstick   Collection Time: 07/17/17  9:49 AM  Result Value Ref Range   Color, UA     Clarity, UA     Glucose, UA neg    Bilirubin, UA     Ketones, UA neg    Spec Grav, UA  1.010 - 1.025   Blood, UA neg    pH, UA  5.0 - 8.0   Protein, UA neg    Urobilinogen, UA  0.2 or  1.0 E.U./dL   Nitrite, UA neg    Leukocytes, UA Negative Negative   Appearance     Odor      Assessment & Plan:  1) Low-risk pregnancy G1P0000 at 8063w3d with an Estimated Date of Delivery: 10/13/17    Meds: No orders of the defined types were placed in this encounter.  Labs/procedures today: Doppler  Plan:  Continue routine obstetrical care   Follow-up: Return in about 4 [place] (around 08/14/2017) for LROB.  Orders Placed This Encounter  Procedures  . POCT urinalysis dipstick   By signing my name below, I, Diona BrownerJennifer Gorman, attest that this documentation has been prepared under the direction and in the presence of Tilda BurrowFerguson, Kateri Balch V, MD. Electronically Signed: Diona BrownerJennifer Gorman, Medical Scribe. 07/17/17. 10:16 AM.  I personally performed the services described in this documentation, which was SCRIBED in my presence. The recorded information has been reviewed and considered accurate. It has been edited as necessary during review. Tilda BurrowJohn V Kendric Sindelar, MD

## 2017-07-18 LAB — CBC
HEMOGLOBIN: 11.1 g/dL (ref 11.1–15.9)
Hematocrit: 33.1 % — ABNORMAL LOW (ref 34.0–46.6)
MCH: 31.1 pg (ref 26.6–33.0)
MCHC: 33.5 g/dL (ref 31.5–35.7)
MCV: 93 fL (ref 79–97)
Platelets: 250 10*3/uL (ref 150–379)
RBC: 3.57 x10E6/uL — ABNORMAL LOW (ref 3.77–5.28)
RDW: 13 % (ref 12.3–15.4)
WBC: 6.5 10*3/uL (ref 3.4–10.8)

## 2017-07-18 LAB — GLUCOSE TOLERANCE, 2 HOURS W/ 1HR
GLUCOSE, FASTING: 70 mg/dL (ref 65–91)
Glucose, 1 hour: 109 mg/dL (ref 65–179)
Glucose, 2 hour: 61 mg/dL — ABNORMAL LOW (ref 65–152)

## 2017-07-18 LAB — ANTIBODY SCREEN: Antibody Screen: NEGATIVE

## 2017-07-18 LAB — HIV ANTIBODY (ROUTINE TESTING W REFLEX): HIV Screen 4th Generation wRfx: NONREACTIVE

## 2017-07-18 LAB — RPR: RPR: NONREACTIVE

## 2017-08-14 ENCOUNTER — Ambulatory Visit (INDEPENDENT_AMBULATORY_CARE_PROVIDER_SITE_OTHER): Payer: Medicaid Other | Admitting: Obstetrics & Gynecology

## 2017-08-14 ENCOUNTER — Encounter: Payer: Self-pay | Admitting: Obstetrics & Gynecology

## 2017-08-14 VITALS — BP 100/70 | HR 70 | Wt 168.5 lb

## 2017-08-14 DIAGNOSIS — Z1389 Encounter for screening for other disorder: Secondary | ICD-10-CM

## 2017-08-14 DIAGNOSIS — Z3403 Encounter for supervision of normal first pregnancy, third trimester: Secondary | ICD-10-CM

## 2017-08-14 DIAGNOSIS — Z3A31 31 weeks gestation of pregnancy: Secondary | ICD-10-CM

## 2017-08-14 DIAGNOSIS — Z331 Pregnant state, incidental: Secondary | ICD-10-CM

## 2017-08-14 LAB — POCT URINALYSIS DIPSTICK
Glucose, UA: NEGATIVE
KETONES UA: NEGATIVE
Nitrite, UA: NEGATIVE
Protein, UA: NEGATIVE
RBC UA: NEGATIVE

## 2017-08-14 NOTE — Progress Notes (Signed)
G1P0000 5752w3d Estimated Date of Delivery: 10/13/17  Blood pressure 100/70, pulse 70, weight 168 lb 8 oz (76.4 kg), last menstrual period 01/06/2017.   BP weight and urine results all reviewed and noted.  Please refer to the obstetrical flow sheet for the fundal height and fetal heart rate documentation:  Patient reports good fetal movement, denies any bleeding and no rupture of membranes symptoms or regular contractions. Patient is without complaints. All questions were answered.  Orders Placed This Encounter  Procedures  . POCT urinalysis dipstick    Plan:  Continued routine obstetrical care,   Return in about 2 [name] (around 08/28/2017) for LROB.

## 2017-08-28 ENCOUNTER — Encounter: Payer: Self-pay | Admitting: Women's Health

## 2017-08-28 ENCOUNTER — Ambulatory Visit (INDEPENDENT_AMBULATORY_CARE_PROVIDER_SITE_OTHER): Payer: Medicaid Other | Admitting: Women's Health

## 2017-08-28 VITALS — BP 110/80 | HR 98 | Wt 170.0 lb

## 2017-08-28 DIAGNOSIS — Z1389 Encounter for screening for other disorder: Secondary | ICD-10-CM

## 2017-08-28 DIAGNOSIS — Z3A33 33 weeks gestation of pregnancy: Secondary | ICD-10-CM

## 2017-08-28 DIAGNOSIS — Z3403 Encounter for supervision of normal first pregnancy, third trimester: Secondary | ICD-10-CM

## 2017-08-28 DIAGNOSIS — Z331 Pregnant state, incidental: Secondary | ICD-10-CM

## 2017-08-28 LAB — POCT URINALYSIS DIPSTICK
GLUCOSE UA: NEGATIVE
KETONES UA: NEGATIVE
Leukocytes, UA: NEGATIVE
Nitrite, UA: NEGATIVE
Protein, UA: NEGATIVE
RBC UA: NEGATIVE

## 2017-08-28 NOTE — Progress Notes (Signed)
   LOW-RISK PREGNANCY VISIT Patient name: Theresa GoslingValencia Mcarthy MRN [id number]  Date of birth: March 26, 1996 Chief Complaint:   Routine Prenatal Visit  History of Present Illness:   Theresa GoslingValencia Enterline is a 22 y.o. 481P0000 female at 370w3d with an Estimated Date of Delivery: 10/13/17 being seen today for ongoing management of a low-risk pregnancy.  Today she reports no complaints. Contractions: Not present.  .  Movement: Present. denies leaking of fluid. Review of Systems:   Pertinent items are noted in HPI Denies abnormal vaginal discharge w/ itching/odor/irritation, headaches, visual changes, shortness of breath, chest pain, abdominal pain, severe nausea/vomiting, or problems with urination or bowel movements unless otherwise stated above. Pertinent History Reviewed:  Reviewed past medical,surgical, social, obstetrical and family history.  Reviewed problem list, medications and allergies. Physical Assessment:   Vitals:   08/28/17 0931  BP: 110/80  Pulse: 98  Weight: 170 lb (77.1 kg)  Body mass index is 28.29 kg/m.        Physical Examination:   General appearance: Well appearing, and in no distress  Mental status: Alert, oriented to person, place, and time  Skin: Warm & dry  Cardiovascular: Normal heart rate noted  Respiratory: Normal respiratory effort, no distress  Abdomen: Soft, gravid, nontender  Pelvic: Cervical exam deferred         Extremities: Edema: David  Fetal Status: Fetal Heart Rate (bpm): 153 Fundal Height: 31 cm Movement: Present    Results for orders placed or performed in visit on 08/28/17 (from the past 24 hour(s))  POCT urinalysis dipstick   Collection Time: 08/28/17  9:34 AM  Result Value Ref Range   Color, UA     Clarity, UA     Glucose, UA neg    Bilirubin, UA     Ketones, UA neg    Spec Grav, UA  1.010 - 1.025   Blood, UA neg    pH, UA  5.0 - 8.0   Protein, UA neg    Urobilinogen, UA  0.2 or 1.0 E.U./dL   Nitrite, UA neg    Leukocytes, UA Negative Negative   Appearance     Odor      Assessment & Plan:  1) Low-risk pregnancy G1P0000 at 170w3d with an Estimated Date of Delivery: 10/13/17    Meds: No orders of the defined types were placed in this encounter.  Labs/procedures today: David  Plan:  Continue routine obstetrical care   Reviewed: Preterm labor symptoms and general obstetric precautions including but not limited to vaginal bleeding, contractions, leaking of fluid and fetal movement were reviewed in detail with the patient. Recommended Tdap at HD/PCP per CDC guidelines.  All questions were answered  Follow-up: Return in about 2 [place] (around 09/11/2017) for LROB.  Orders Placed This Encounter  Procedures  . POCT urinalysis dipstick   Cheral MarkerKimberly R Blayklee Mable CNM, [facility] 08/28/2017 9:52 AM

## 2017-08-28 NOTE — Patient Instructions (Signed)
Theresa David, I greatly value your feedback.  If you receive a survey following your visit with us today, we appreciate you taking the time to fill it out.  Thanks, Theresa David, CNM, WHNP-BC   Call the office [phone]) or go to [facility] if:  You begin to have strong, frequent contractions  Your water breaks.  Sometimes it is a big gush of fluid, sometimes it is just a trickle that keeps getting your panties wet or running down your legs  You have vaginal bleeding.  It is normal to have a small amount of spotting if your cervix was checked.   You don't feel your baby moving like normal.  If you don't, get you something to eat and drink and lay down and focus on feeling your baby move.  You should feel at least 10 movements in 2 hours.  If you don't, you should call the office or [facility] to [facility].    Tdap Vaccine  It is recommended that you get the Tdap vaccine during the third trimester of EACH pregnancy to help protect your baby from getting pertussis (whooping cough)  27-36 [place] is the BEST time to do this so that you can pass the protection on to your baby. During pregnancy is better than after pregnancy, but if you are unable to get it during pregnancy it will be offered at the hospital.   You can get this vaccine at the health department or your family doctor  Everyone who will be around your baby should also be up-to-date on their vaccines. Adults (who are not pregnant) only need 1 dose of Tdap during adulthood.   Third Trimester of Pregnancy The third trimester is from week 29 through week 42, months 7 through 9. The third trimester is a time when the fetus is growing rapidly. At the end of the ninth month, the fetus is about 20 inches in length and weighs 6-10 pounds.  BODY CHANGES Your body goes through many changes during pregnancy. The changes vary from woman to woman.   Your weight will continue to increase. You can expect to gain 25-35 pounds (11-16 kg) by  the end of the pregnancy.  You may begin to get stretch marks on your hips, abdomen, and breasts.  You may urinate more often because the fetus is moving lower into your pelvis and pressing on your bladder.  You may develop or continue to have heartburn as a result of your pregnancy.  You may develop constipation because certain hormones are causing the muscles that push waste through your intestines to slow down.  You may develop hemorrhoids or swollen, bulging veins (varicose veins).  You may have pelvic pain because of the weight gain and pregnancy hormones relaxing your joints between the bones in your pelvis. Backaches may result from overexertion of the muscles supporting your posture.  You may have changes in your hair. These can include thickening of your hair, rapid growth, and changes in texture. Some women also have hair loss during or after pregnancy, or hair that feels dry or thin. Your hair will most likely return to normal after your baby is born.  Your breasts will continue to grow and be tender. A yellow discharge may leak from your breasts called colostrum.  Your belly button may stick out.  You may feel short of breath because of your expanding uterus.  You may notice the fetus "dropping," or moving lower in your abdomen.  You may have a bloody mucus  discharge. This usually occurs a few days to a week before labor begins.  Your cervix becomes thin and soft (effaced) near your due date. WHAT TO EXPECT AT YOUR PRENATAL EXAMS  You will have prenatal exams every 2 [place] until week 36. Then, you will have weekly prenatal exams. During a routine prenatal visit:  You will be weighed to make sure you and the fetus are growing normally.  Your blood pressure is taken.  Your abdomen will be measured to track your baby's growth.  The fetal heartbeat will be listened to.  Any test results from the previous visit will be discussed.  You may have a cervical check near your  due date to see if you have effaced. At around 36 [place], your caregiver will check your cervix. At the same time, your caregiver will also perform a test on the secretions of the vaginal tissue. This test is to determine if a type of bacteria, Group B streptococcus, is present. Your caregiver will explain this further. Your caregiver may ask you:  What your birth plan is.  How you are feeling.  If you are feeling the baby move.  If you have had any abnormal symptoms, such as leaking fluid, bleeding, severe headaches, or abdominal cramping.  If you have any questions. Other tests or screenings that may be performed during your third trimester include:  Blood tests that check for low iron levels (anemia).  Fetal testing to check the health, activity level, and growth of the fetus. Testing is done if you have certain medical conditions or if there are problems during the pregnancy. FALSE LABOR You may feel small, irregular contractions that eventually go away. These are called Braxton Hicks contractions, or false labor. Contractions may last for hours, days, or even [place] before true labor sets in. If contractions come at regular intervals, intensify, or become painful, it is best to be seen by your caregiver.  SIGNS OF LABOR   Menstrual-like cramps.  Contractions that are 5 minutes apart or less.  Contractions that start on the top of the uterus and spread down to the lower abdomen and back.  A sense of increased pelvic pressure or back pain.  A watery or bloody mucus discharge that comes from the vagina. If you have any of these signs before the 37th week of pregnancy, call your caregiver right away. You need to go to the hospital to get checked immediately. HOME CARE INSTRUCTIONS   Avoid all smoking, herbs, alcohol, and unprescribed drugs. These chemicals affect the formation and growth of the baby.  Follow your caregiver's instructions regarding medicine use. There are medicines  that are either safe or unsafe to take during pregnancy.  Exercise only as directed by your caregiver. Experiencing uterine cramps is a good sign to stop exercising.  Continue to eat regular, healthy meals.  Wear a good support bra for breast tenderness.  Do not use hot tubs, steam rooms, or saunas.  Wear your seat belt at all times when driving.  Avoid raw meat, uncooked cheese, cat litter boxes, and soil used by cats. These carry germs that can cause birth defects in the baby.  Take your prenatal vitamins.  Try taking a stool softener (if your caregiver approves) if you develop constipation. Eat more high-fiber foods, such as fresh vegetables or fruit and whole grains. Drink plenty of fluids to keep your urine clear or pale yellow.  Take warm sitz baths to soothe any pain or discomfort caused by hemorrhoids. Use  hemorrhoid cream if your caregiver approves.  If you develop varicose veins, wear support hose. Elevate your feet for 15 minutes, 3-4 times a day. Limit salt in your diet.  Avoid heavy lifting, wear low heal shoes, and practice good posture.  Rest a lot with your legs elevated if you have leg cramps or low back pain.  Visit your dentist if you have not gone during your pregnancy. Use a soft toothbrush to brush your teeth and be gentle when you floss.  A sexual relationship may be continued unless your caregiver directs you otherwise.  Do not travel far distances unless it is absolutely necessary and only with the approval of your caregiver.  Take prenatal classes to understand, practice, and ask questions about the labor and delivery.  Make a trial run to the hospital.  Pack your hospital bag.  Prepare the baby's nursery.  Continue to go to all your prenatal visits as directed by your caregiver. SEEK MEDICAL CARE IF:  You are unsure if you are in labor or if your water has broken.  You have dizziness.  You have mild pelvic cramps, pelvic pressure, or nagging  pain in your abdominal area.  You have persistent nausea, vomiting, or diarrhea.  You have a bad smelling vaginal discharge.  You have pain with urination. SEEK IMMEDIATE MEDICAL CARE IF:   You have a fever.  You are leaking fluid from your vagina.  You have spotting or bleeding from your vagina.  You have severe abdominal cramping or pain.  You have rapid weight loss or gain.  You have shortness of breath with chest pain.  You notice sudden or extreme swelling of your face, hands, ankles, feet, or legs.  You have not felt your baby move in over an hour.  You have severe headaches that do not go away with medicine.  You have vision changes. Document Released: 04/29/2001 Document Revised: 05/10/2013 Document Reviewed: 07/06/2012 [facility] Patient Information 2015 [place], [place]. This information is not intended to replace advice given to you by your health care provider. Make sure you discuss any questions you have with your health care provider.

## 2017-09-11 ENCOUNTER — Other Ambulatory Visit: Payer: Self-pay

## 2017-09-11 ENCOUNTER — Encounter: Payer: Self-pay | Admitting: Women's Health

## 2017-09-11 ENCOUNTER — Ambulatory Visit (INDEPENDENT_AMBULATORY_CARE_PROVIDER_SITE_OTHER): Payer: Medicaid Other | Admitting: Women's Health

## 2017-09-11 VITALS — BP 118/70 | HR 74 | Wt 175.0 lb

## 2017-09-11 DIAGNOSIS — Z3403 Encounter for supervision of normal first pregnancy, third trimester: Secondary | ICD-10-CM

## 2017-09-11 DIAGNOSIS — Z1389 Encounter for screening for other disorder: Secondary | ICD-10-CM

## 2017-09-11 DIAGNOSIS — Z331 Pregnant state, incidental: Secondary | ICD-10-CM

## 2017-09-11 DIAGNOSIS — Z3A35 35 weeks gestation of pregnancy: Secondary | ICD-10-CM

## 2017-09-11 LAB — POCT URINALYSIS DIPSTICK
Glucose, UA: NEGATIVE
Ketones, UA: NEGATIVE
NITRITE UA: NEGATIVE
PROTEIN UA: NEGATIVE
RBC UA: NEGATIVE

## 2017-09-11 NOTE — Progress Notes (Signed)
   LOW-RISK PREGNANCY VISIT Patient name: Theresa David MRN [id number]  Date of birth: September 02, 1995 Chief Complaint:   Routine Prenatal Visit  History of Present Illness:   Theresa David is a 22 y.o. 341P0000 female at 8627w3d with an Estimated Date of Delivery: 10/13/17 being seen today for ongoing management of a low-risk pregnancy.  Today she reports some irregular cramping. Contractions: Not present. Vag. Bleeding: None.  Movement: Present. denies leaking of fluid. Review of Systems:   Pertinent items are noted in HPI Denies abnormal vaginal discharge w/ itching/odor/irritation, headaches, visual changes, shortness of breath, chest pain, abdominal pain, severe nausea/vomiting, or problems with urination or bowel movements unless otherwise stated above. Pertinent History Reviewed:  Reviewed past medical,surgical, social, obstetrical and family history.  Reviewed problem list, medications and allergies. Physical Assessment:   Vitals:   09/11/17 0927  BP: 118/70  Pulse: 74  Weight: 175 lb (79.4 kg)  Body mass index is 29.12 kg/m.        Physical Examination:   General appearance: Well appearing, and in no distress  Mental status: Alert, oriented to person, place, and time  Skin: Warm & dry  Cardiovascular: Normal heart rate noted  Respiratory: Normal respiratory effort, no distress  Abdomen: Soft, gravid, nontender  Pelvic: Cervical exam deferred         Extremities: Edema: None  Fetal Status: Fetal Heart Rate (bpm): 139 Fundal Height: 33 cm Movement: Present    Results for orders placed or performed in visit on 09/11/17 (from the past 24 hour(s))  POCT urinalysis dipstick   Collection Time: 09/11/17  9:29 AM  Result Value Ref Range   Color, UA     Clarity, UA     Glucose, UA neg    Bilirubin, UA     Ketones, UA neg    Spec Grav, UA  1.010 - 1.025   Blood, UA neg    pH, UA  5.0 - 8.0   Protein, UA neg    Urobilinogen, UA  0.2 or 1.0 E.U./dL   Nitrite, UA neg    Leukocytes, UA Small (1+) (A) Negative   Appearance     Odor      Assessment & Plan:  1) Low-risk pregnancy G1P0000 at 5527w3d with an Estimated Date of Delivery: 10/13/17    Meds: No orders of the defined types were placed in this encounter.  Labs/procedures today: none  Plan:  Continue routine obstetrical care   Reviewed: Preterm labor symptoms and general obstetric precautions including but not limited to vaginal bleeding, contractions, leaking of fluid and fetal movement were reviewed in detail with the patient.  All questions were answered  Follow-up: Return in about 10 days (around 09/21/2017) for LROB. and gbs, gc/ct  Orders Placed This Encounter  Procedures  . POCT urinalysis dipstick   [name] CNM, [facility] 09/11/2017 9:37 AM

## 2017-09-11 NOTE — Patient Instructions (Signed)
Billey GoslingValencia Bonfield, I greatly value your feedback.  If you receive a survey following your visit with us today, we appreciate you taking the time to fill it out.  Thanks, Joellyn HaffKim Davion Flannery, CNM, WHNP-BC   Call the office [phone]) or go to [facility] if:  You begin to have strong, frequent contractions  Your water breaks.  Sometimes it is a big gush of fluid, sometimes it is just a trickle that keeps getting your panties wet or running down your legs  You have vaginal bleeding.  It is normal to have a small amount of spotting if your cervix was checked.   You don't feel your baby moving like normal.  If you don't, get you something to eat and drink and lay down and focus on feeling your baby move.  You should feel at least 10 movements in 2 hours.  If you don't, you should call the office or go to [facility].     Preterm Labor and Birth Information The normal length of a pregnancy is 39-41 [place]. Preterm labor is when labor starts before 37 completed [place] of pregnancy. What are the risk factors for preterm labor? Preterm labor is more likely to occur in women who:  Have certain infections during pregnancy such as a bladder infection, sexually transmitted infection, or infection inside the uterus (chorioamnionitis).  Have a shorter-than-normal cervix.  Have gone into preterm labor before.  Have had surgery on their cervix.  Are younger than age 21 or older than age 21.  Are African American.  Are pregnant with twins or multiple babies (multiple gestation).  Take street drugs or smoke while pregnant.  Do not gain enough weight while pregnant.  Became pregnant shortly after having been pregnant.  What are the symptoms of preterm labor? Symptoms of preterm labor include:  Cramps similar to those that can happen during a menstrual period. The cramps may happen with diarrhea.  Pain in the abdomen or lower back.  Regular uterine contractions that may feel like tightening of  the abdomen.  A feeling of increased pressure in the pelvis.  Increased watery or bloody mucus discharge from the vagina.  Water breaking (ruptured amniotic sac).  Why is it important to recognize signs of preterm labor? It is important to recognize signs of preterm labor because babies who are born prematurely may not be fully developed. This can put them at an increased risk for:  Long-term (chronic) heart and lung problems.  Difficulty immediately after birth with regulating body systems, including blood sugar, body temperature, heart rate, and breathing rate.  Bleeding in the brain.  Cerebral palsy.  Learning difficulties.  Death.  These risks are highest for babies who are born before 34 [place] of pregnancy. How is preterm labor treated? Treatment depends on the length of your pregnancy, your condition, and the health of your baby. It may involve:  Having a stitch (suture) placed in your cervix to prevent your cervix from opening too early (cerclage).  Taking or being given medicines, such as: ? Hormone medicines. These may be given early in pregnancy to help support the pregnancy. ? Medicine to stop contractions. ? Medicines to help mature the baby's lungs. These may be prescribed if the risk of delivery is high. ? Medicines to prevent your baby from developing cerebral palsy.  If the labor happens before 34 [place] of pregnancy, you may need to stay in the hospital. What should I do if I think I am in preterm labor? If you  think that you are going into preterm labor, call your health care provider right away. How can I prevent preterm labor in future pregnancies? To increase your chance of having a full-term pregnancy:  Do not use any tobacco products, such as cigarettes, chewing tobacco, and e-cigarettes. If you need help quitting, ask your health care provider.  Do not use street drugs or medicines that have not been prescribed to you during your pregnancy.  Talk  with your health care provider before taking any herbal supplements, even if you have been taking them regularly.  Make sure you gain a healthy amount of weight during your pregnancy.  Watch for infection. If you think that you might have an infection, get it checked right away.  Make sure to tell your health care provider if you have gone into preterm labor before.  This information is not intended to replace advice given to you by your health care provider. Make sure you discuss any questions you have with your health care provider. Document Released: 07/26/2003 Document Revised: 10/16/2015 Document Reviewed: 09/26/2015 Elsevier Interactive Patient Education  2018 [employer].

## 2017-09-16 ENCOUNTER — Telehealth: Payer: Self-pay | Admitting: Women's Health

## 2017-09-16 NOTE — Telephone Encounter (Signed)
Informed patient she needs fasting bile acids and CMP as soon as she can. Try cool showers/wash cloths, avoid hot showers. Try aquaphor/eucerin creams, hydrocortisone cream, benadryl po.  Patient states she can come in the morning.

## 2017-09-16 NOTE — Telephone Encounter (Signed)
Patient states for the past few [place] she has been very itchy but it is getting worse.  Could not sleep last night due to feet itching.  States the sides of her fingers, stomach, and legs are itchy as well. Next appt not until 5/6, please advise if you want labs.

## 2017-09-16 NOTE — Telephone Encounter (Signed)
Pt states that she might have cholestasis. Pt states that she is 36 [place] pregnant and get very itchy at night. Please contact pt

## 2017-09-17 ENCOUNTER — Other Ambulatory Visit: Payer: Self-pay

## 2017-09-18 LAB — COMPREHENSIVE METABOLIC PANEL
A/G RATIO: 1.1 — AB (ref 1.2–2.2)
ALT: 8 IU/L (ref 0–32)
AST: 18 IU/L (ref 0–40)
Albumin: 3.4 g/dL — ABNORMAL LOW (ref 3.5–5.5)
Alkaline Phosphatase: 189 IU/L — ABNORMAL HIGH (ref 39–117)
BUN/Creatinine Ratio: 8 — ABNORMAL LOW (ref 9–23)
BUN: 5 mg/dL — ABNORMAL LOW (ref 6–20)
Bilirubin Total: 0.4 mg/dL (ref 0.0–1.2)
CALCIUM: 8.8 mg/dL (ref 8.7–10.2)
CO2: 20 mmol/L (ref 20–29)
CREATININE: 0.66 mg/dL (ref 0.57–1.00)
Chloride: 103 mmol/L (ref 96–106)
GFR, EST AFRICAN AMERICAN: 147 mL/min/{1.73_m2} (ref >59)
GFR, EST NON AFRICAN AMERICAN: 128 mL/min/{1.73_m2} (ref >59)
GLOBULIN, TOTAL: 3.2 g/dL (ref 1.5–4.5)
Glucose: 70 mg/dL (ref 65–99)
POTASSIUM: 3.8 mmol/L (ref 3.5–5.2)
SODIUM: 137 mmol/L (ref 134–144)
TOTAL PROTEIN: 6.6 g/dL (ref 6.0–8.5)

## 2017-09-18 LAB — BILE ACIDS, TOTAL: Bile Acids Total: 12.5 umol/L (ref 4.7–24.5)

## 2017-09-21 ENCOUNTER — Encounter: Payer: Self-pay | Admitting: Women's Health

## 2017-09-21 ENCOUNTER — Ambulatory Visit (INDEPENDENT_AMBULATORY_CARE_PROVIDER_SITE_OTHER): Payer: Medicaid Other | Admitting: Women's Health

## 2017-09-21 ENCOUNTER — Telehealth (HOSPITAL_COMMUNITY): Payer: Self-pay | Admitting: *Deleted

## 2017-09-21 VITALS — BP 100/68 | HR 89 | Wt 172.2 lb

## 2017-09-21 DIAGNOSIS — Z331 Pregnant state, incidental: Secondary | ICD-10-CM

## 2017-09-21 DIAGNOSIS — K831 Obstruction of bile duct: Secondary | ICD-10-CM | POA: Diagnosis not present

## 2017-09-21 DIAGNOSIS — O26619 Liver and biliary tract disorders in pregnancy, unspecified trimester: Secondary | ICD-10-CM

## 2017-09-21 DIAGNOSIS — O0993 Supervision of high risk pregnancy, unspecified, third trimester: Secondary | ICD-10-CM

## 2017-09-21 DIAGNOSIS — O26613 Liver and biliary tract disorders in pregnancy, third trimester: Secondary | ICD-10-CM | POA: Diagnosis not present

## 2017-09-21 DIAGNOSIS — Z3A36 36 weeks gestation of pregnancy: Secondary | ICD-10-CM

## 2017-09-21 DIAGNOSIS — Z1389 Encounter for screening for other disorder: Secondary | ICD-10-CM

## 2017-09-21 LAB — POCT URINALYSIS DIPSTICK
Glucose, UA: NEGATIVE
Ketones, UA: NEGATIVE
Leukocytes, UA: NEGATIVE
NITRITE UA: NEGATIVE
PROTEIN UA: NEGATIVE
RBC UA: NEGATIVE

## 2017-09-21 NOTE — Progress Notes (Signed)
   HIGH-RISK PREGNANCY VISIT Patient name: Theresa David MRN [id number]  Date of birth: 1995-07-02 Chief Complaint:   Routine Prenatal Visit (GBS, GC/CHL)  History of Present Illness:   Theresa David is a 22 y.o. G80P0000 female at [redacted]w[redacted]d with an Estimated Date of Delivery: 10/13/17 being seen today for ongoing management of a high-risk pregnancy complicated by cholestasis of pregnancy dx officially today. Bile Acids 12.5 on 5/2.  Today she reports no complaints. Contractions: Not present.  .  Movement: Present. denies leaking of fluid.  Review of Systems:   Pertinent items are noted in HPI Denies abnormal vaginal discharge w/ itching/odor/irritation, headaches, visual changes, shortness of breath, chest pain, abdominal pain, severe nausea/vomiting, or problems with urination or bowel movements unless otherwise stated above. Pertinent History Reviewed:  Reviewed past medical,surgical, social, obstetrical and family history.  Reviewed problem list, medications and allergies. Physical Assessment:   Vitals:   09/21/17 1209  BP: 100/68  Pulse: 89  Weight: 172 lb 3.2 oz (78.1 kg)  Body mass index is 28.66 kg/m.           Physical Examination:   General appearance: alert, well appearing, and in no distress  Mental status: alert, oriented to person, place, and time  Skin: warm & dry   Extremities: Edema: None    Cardiovascular: normal heart rate noted  Respiratory: normal respiratory effort, no distress  Abdomen: gravid, soft, non-tender  Pelvic: Cervical exam deferred, declines         Fetal Status: Fetal Heart Rate (bpm): 135 Fundal Height: 36 cm Movement: Present Presentation: Vertex  Fetal Surveillance Testing today: NST: FHR baseline 135 bpm, Variability: moderate, Accelerations:present, Decelerations:  Absent= Cat 1 /Reactive Toco: none     No results found for this or any previous visit (from the past 24 hour(s)).  Assessment & Plan:  1) High-risk pregnancy G1P0000 at [redacted]w[redacted]d  with an Estimated Date of Delivery: 10/13/17   2) Cholestasis of pregnancy, dx today, NST reactive, IOL scheduled for tomorrow night at midnight.  IOL form faxed via Epic and orders placed   Meds: No orders of the defined types were placed in this encounter.   Labs/procedures today: nst, gbs, gc/ct  Treatment Plan:  IOL tomorrow night (1st available)   Reviewed: Term labor symptoms and general obstetric precautions including but not limited to vaginal bleeding, contractions, leaking of fluid and fetal movement were reviewed in detail with the patient.  All questions were answered.  Follow-up: Return for 6wks for pp visit.  Orders Placed This Encounter  Procedures  . GC/Chlamydia Probe Amp  . Strep Gp B NAA  . POCT urinalysis dipstick   Cheral Marker CNM, [facility] 09/21/2017 12:56 PM

## 2017-09-21 NOTE — Telephone Encounter (Signed)
Preadmission screen  

## 2017-09-21 NOTE — Treatment Plan (Signed)
Induction Assessment Scheduling Form Fax to [facility]:  [phone]  Theresa David                                                                                   DOB:  January 29, 1996                                                            MRN:  [id number]                                                                     Phone #:   [phone]                         Provider:  [facility]  GP:  G1P0000                                                            Estimated Date of Delivery: 10/13/17  Dating Criteria: LMP c/w 7wk u/s    Medical Indications for induction:  ICP Admission Date/Time:  5/8 @ [facility] Gestational age on admission:  6.1   Filed Weights   09/21/17 1209  Weight: 172 lb 3.2 oz (78.1 kg)   HIV:  Non Reactive (03/01 0924) GBS:  pending  SVE: TBD   Method of induction(proposed):  cytotec   Scheduling Provider Signature:  Cheral Marker, CNM                                            Today's Date:  09/21/2017

## 2017-09-21 NOTE — Patient Instructions (Addendum)
Theresa David, I greatly value your feedback.  If you receive a survey following your visit with [place] today, we appreciate you taking the time to fill it out.  Thanks, [name], CNM, WHNP-BC  Your induction is scheduled for Tues 5/7 @ 11:45pm. Go to [facility] hospital, Maternity Admissions Unit (Emergency) entrance and let them know you are there to be induced. They will send someone from Labor & Delivery to come get you.     Call the office [phone]) or go to [facility] if:  You begin to have strong, frequent contractions  Your water breaks.  Sometimes it is a big gush of fluid, sometimes it is just a trickle that keeps getting your panties wet or running down your legs  You have vaginal bleeding.  It is normal to have a small amount of spotting if your cervix was checked.   You don't feel your baby moving like normal.  If you don't, get you something to eat and drink and lay down and focus on feeling your baby move.  You should feel at least 10 movements in 2 hours.  If you don't, you should call the office or go to [facility].     [facility] Contractions Contractions of the uterus can occur throughout pregnancy, but they are not always a sign that you are in labor. You may have practice contractions called Braxton Hicks contractions. These false labor contractions are sometimes confused with true labor. What are [name] contractions? Braxton Hicks contractions are tightening movements that occur in the muscles of the uterus before labor. Unlike true labor contractions, these contractions do not result in opening (dilation) and thinning of the cervix. Toward the end of pregnancy (32-34 [place]), Braxton Hicks contractions can happen more often and may become stronger. These contractions are sometimes difficult to tell apart from true labor because they can be very uncomfortable. You should not feel embarrassed if you go to the hospital with false labor. Sometimes, the only  way to tell if you are in true labor is for your health care provider to look for changes in the cervix. The health care provider will do a physical exam and may monitor your contractions. If you are not in true labor, the exam should show that your cervix is not dilating and your water has not broken. If there are other health problems associated with your pregnancy, it is completely safe for you to be sent home with false labor. You may continue to have Braxton Hicks contractions until you go into true labor. How to tell the difference between true labor and false labor True labor  Contractions last 30-70 seconds.  Contractions become very regular.  Discomfort is usually felt in the top of the uterus, and it spreads to the lower abdomen and low back.  Contractions do not go away with walking.  Contractions usually become more intense and increase in frequency.  The cervix dilates and gets thinner. False labor  Contractions are usually shorter and not as strong as true labor contractions.  Contractions are usually irregular.  Contractions are often felt in the front of the lower abdomen and in the groin.  Contractions may go away when you walk around or change positions while lying down.  Contractions get weaker and are shorter-lasting as time goes on.  The cervix usually does not dilate or become thin. Follow these instructions at home:  Take over-the-counter and prescription medicines only as told by your health care provider.  Keep  up with your usual exercises and follow other instructions from your health care provider.  Eat and drink lightly if you think you are going into labor.  If Braxton Hicks contractions are making you uncomfortable: ? Change your position from lying down or resting to walking, or change from walking to resting. ? Sit and rest in a tub of warm water. ? Drink enough fluid to keep your urine pale yellow. Dehydration may cause these  contractions. ? Do slow and deep breathing several times an hour.  Keep all follow-up prenatal visits as told by your health care provider. This is important. Contact a health care provider if:  You have a fever.  You have continuous pain in your abdomen. Get help right away if:  Your contractions become stronger, more regular, and closer together.  You have fluid leaking or gushing from your vagina.  You pass blood-tinged mucus (bloody show).  You have bleeding from your vagina.  You have low back pain that you never had before.  You feel your baby's head pushing down and causing pelvic pressure.  Your baby is not moving inside you as much as it used to. Summary  Contractions that occur before labor are called Braxton Hicks contractions, false labor, or practice contractions.  Braxton Hicks contractions are usually shorter, weaker, farther apart, and less regular than true labor contractions. True labor contractions usually become progressively stronger and regular and they become more frequent.  Manage discomfort from [facility] contractions by changing position, resting in a warm bath, drinking plenty of water, or practicing deep breathing. This information is not intended to replace advice given to you by your health care provider. Make sure you discuss any questions you have with your health care provider. Document Released: 09/18/2016 Document Revised: 09/18/2016 Document Reviewed: 09/18/2016 Elsevier Interactive Patient Education  2018 [employer].   Cholestasis of Pregnancy Cholestasis refers to any condition that causes the flow of digestive fluid (bile) produced by the liver to slow down or stop. Cholestasis of pregnancy is most common toward the end of pregnancy (thirdtrimester), but it can occur any time during pregnancy. The condition often goes away soon after giving birth. Cholestasis may be uncomfortable, but it is usually harmless to you. However, it can be  harmful to your baby. Cholestasis may increase the risk of:  Your baby being born too early (preterm delivery).  Your baby having a slow heart rate and lack of oxygen during delivery (fetal distress).  Losing your baby before delivery (stillbirth).  What are the causes? The exact cause of this condition is not known, but it may be related to:  Pregnancy hormones. The gallbladder normally holds bile until you need it to help digest fat in your diet. Pregnancy hormones may cause the flow of bile to slow down and back up into your liver. Bile may then get into your bloodstream and cause cholestasis symptoms.  Changes in your genes (genetic mutations). Specifically, genes that affect how the liver releases bile.  What increases the risk? You are more likely to develop this condition if:  You had cholestasis during a previous pregnancy.  You have a family history of cholestasis.  You have liver problems.  You are having multiple babies, such as twins or triplets.  What are the signs or symptoms? The most common symptom of this condition is intense itching (pruritus), especially on the palms of your hands and soles of your feet. The itching can spread to the rest of your body  and is often worse at night. You will not usually have a rash. Other symptoms may include:  Feeling tired.  Pain in your upper right abdomen.  Dark-colored urine.  Light-colored stools.  Poor appetite.  Yellowish discoloration of your skin and the whites of your eyes (jaundice).  How is this diagnosed? This condition is diagnosed based on:  Your medical history.  A physical exam.  Blood tests.  If you have an inherited risk for developing this condition, you may also have genetic testing. How is this treated? The goal of treatment is to make you comfortable and keep your baby safe. Your health care provider may:  Prescribe medicine to reduce bile acid in your bloodstream, relieve symptoms, and  help keep your baby safe.  Give you vitamin K before delivery to prevent excessive bleeding.  Check your baby frequently (fetal monitoring).  Perform regular blood tests to check your bile levels and liver function until your baby is delivered.  Recommend starting (inducing) your labor and delivery by week 36 or 37 of pregnancy, or as soon as your baby's lungs have developed enough.  Follow these instructions at home:  Take over-the-counter and prescription medicines only as told by your health care provider.  Take cool baths to soothe itchy skin.  Wear comfortable, loose-fitting, cotton clothing to reduce itching.  Keep your fingernails short to prevent skin irritation from scratching.  Keep all follow-up visits and prenatal visits as told by your health care provider. This is important. Contact a health care provider if:  Your symptoms get worse, even with treatment.  You develop pain in your right side.  You have unusual swelling in your abdomen, feet, ankles, or legs.  You have a fever.  You are more thirsty than usual. Get help right away if:  You go into early labor.  You have a headache that does not go away or causes changes in vision.  You have nausea or you vomit.  You have severe pain in your abdomen or shoulders.  You have shortness of breath. Summary  Cholestasis of pregnancy is most common toward the end of pregnancy (thirdtrimester), but it can occur any time during your pregnancy.  The condition often goes away soon after your baby is born.  The most common symptom of cholestasis of pregnancy is intense itching (pruritus), especially on the palms of your hands and soles of your feet.  This condition may be treated with medicine, frequent monitoring, or starting (inducing) labor and delivery by week 36 or 37 of pregnancy. This information is not intended to replace advice given to you by your health care provider. Make sure you discuss any questions  you have with your health care provider. Document Released: 05/02/2000 Document Revised: 04/19/2016 Document Reviewed: 04/19/2016 Elsevier Interactive Patient Education  2017 [employer].

## 2017-09-22 ENCOUNTER — Other Ambulatory Visit: Payer: Self-pay | Admitting: Advanced Practice Midwife

## 2017-09-23 ENCOUNTER — Encounter (HOSPITAL_COMMUNITY): Payer: Self-pay | Admitting: Anesthesiology

## 2017-09-23 ENCOUNTER — Other Ambulatory Visit: Payer: Self-pay

## 2017-09-23 ENCOUNTER — Inpatient Hospital Stay (HOSPITAL_COMMUNITY)
Admission: RE | Admit: 2017-09-23 | Discharge: 2017-09-25 | DRG: 805 | Disposition: A | Discharge status: Home / Self Care | Payer: Medicaid Other | Source: Ambulatory Visit | Attending: Obstetrics and Gynecology | Admitting: Obstetrics and Gynecology

## 2017-09-23 ENCOUNTER — Encounter (HOSPITAL_COMMUNITY): Payer: Self-pay

## 2017-09-23 DIAGNOSIS — O9902 Anemia complicating childbirth: Secondary | ICD-10-CM | POA: Diagnosis present

## 2017-09-23 DIAGNOSIS — Z3A37 37 weeks gestation of pregnancy: Secondary | ICD-10-CM

## 2017-09-23 DIAGNOSIS — O2662 Liver and biliary tract disorders in childbirth: Principal | ICD-10-CM | POA: Diagnosis present

## 2017-09-23 DIAGNOSIS — Z8719 Personal history of other diseases of the digestive system: Secondary | ICD-10-CM | POA: Diagnosis present

## 2017-09-23 DIAGNOSIS — O0990 Supervision of high risk pregnancy, unspecified, unspecified trimester: Secondary | ICD-10-CM

## 2017-09-23 DIAGNOSIS — K831 Obstruction of bile duct: Secondary | ICD-10-CM

## 2017-09-23 DIAGNOSIS — D649 Anemia, unspecified: Secondary | ICD-10-CM | POA: Diagnosis present

## 2017-09-23 DIAGNOSIS — Z8759 Personal history of other complications of pregnancy, childbirth and the puerperium: Secondary | ICD-10-CM

## 2017-09-23 LAB — CBC
HCT: 34.6 % — ABNORMAL LOW (ref 36.0–46.0)
Hemoglobin: 11.5 g/dL — ABNORMAL LOW (ref 12.0–15.0)
MCH: 31.6 pg (ref 26.0–34.0)
MCHC: 33.2 g/dL (ref 30.0–36.0)
MCV: 95.1 fL (ref 78.0–100.0)
Platelets: 265 10*3/uL (ref 150–400)
RBC: 3.64 MIL/uL — ABNORMAL LOW (ref 3.87–5.11)
RDW: 14 % (ref 11.5–15.5)
WBC: 8.2 10*3/uL (ref 4.0–10.5)

## 2017-09-23 LAB — GC/CHLAMYDIA PROBE AMP
Chlamydia trachomatis, NAA: NEGATIVE
Neisseria gonorrhoeae by PCR: NEGATIVE

## 2017-09-23 LAB — ABO/RH: ABO/RH(D): B POS

## 2017-09-23 LAB — TYPE AND SCREEN
ABO/RH(D): B POS
Antibody Screen: NEGATIVE

## 2017-09-23 LAB — STREP GP B NAA: Strep Gp B NAA: NEGATIVE

## 2017-09-23 LAB — GROUP B STREP BY PCR: GROUP B STREP BY PCR: NEGATIVE

## 2017-09-23 LAB — RPR: RPR: NONREACTIVE

## 2017-09-23 MED ORDER — COCONUT OIL OIL: 1.0000 "application " | TOPICAL_OIL | Status: DC | PRN

## 2017-09-23 MED ORDER — OXYCODONE-ACETAMINOPHEN 5-325 MG PO TABS: 2.0000 | ORAL_TABLET | ORAL | Status: DC | PRN

## 2017-09-23 MED ORDER — OXYTOCIN BOLUS FROM INFUSION
500.0000 mL | Freq: Once | INTRAVENOUS | Status: AC
  Administered 2017-09-23: 500 mL via INTRAVENOUS

## 2017-09-23 MED ORDER — SOD CITRATE-CITRIC ACID 500-334 MG/5ML PO SOLN: 30.0000 mL | ORAL | Status: DC | PRN

## 2017-09-23 MED ORDER — DIPHENHYDRAMINE HCL 25 MG PO CAPS: 25.0000 mg | ORAL_CAPSULE | Freq: Four times a day (QID) | ORAL | Status: DC | PRN

## 2017-09-23 MED ORDER — FLEET ENEMA 7-19 GM/118ML RE ENEM: 1.0000 | ENEMA | RECTAL | Status: DC | PRN

## 2017-09-23 MED ORDER — OXYCODONE-ACETAMINOPHEN 5-325 MG PO TABS: 1.0000 | ORAL_TABLET | ORAL | Status: DC | PRN

## 2017-09-23 MED ORDER — MISOPROSTOL 25 MCG QUARTER TABLET
25.0000 ug | ORAL_TABLET | ORAL | Status: DC | PRN
  Administered 2017-09-23 (×2): 25 ug via VAGINAL
  Filled 2017-09-23 (×4): qty 1

## 2017-09-23 MED ORDER — ONDANSETRON HCL 4 MG/2ML IJ SOLN: 4.0000 mg | INTRAMUSCULAR | Status: DC | PRN

## 2017-09-23 MED ORDER — ACETAMINOPHEN 325 MG PO TABS: 650.0000 mg | ORAL_TABLET | ORAL | Status: DC | PRN

## 2017-09-23 MED ORDER — DIBUCAINE 1 % RE OINT: 1.0000 "application " | TOPICAL_OINTMENT | RECTAL | Status: DC | PRN

## 2017-09-23 MED ORDER — LIDOCAINE HCL (PF) 1 % IJ SOLN
30.0000 mL | INTRAMUSCULAR | Status: DC | PRN
  Administered 2017-09-23: 30 mL via SUBCUTANEOUS
  Filled 2017-09-23: qty 30

## 2017-09-23 MED ORDER — ZOLPIDEM TARTRATE 5 MG PO TABS: 5.0000 mg | ORAL_TABLET | Freq: Every evening | ORAL | Status: DC | PRN

## 2017-09-23 MED ORDER — FENTANYL CITRATE (PF) 100 MCG/2ML IJ SOLN
100.0000 ug | INTRAMUSCULAR | Status: DC | PRN
  Administered 2017-09-23 (×4): 100 ug via INTRAVENOUS
  Filled 2017-09-23 (×4): qty 2

## 2017-09-23 MED ORDER — WITCH HAZEL-GLYCERIN EX PADS: 1.0000 "application " | MEDICATED_PAD | CUTANEOUS | Status: DC | PRN

## 2017-09-23 MED ORDER — OXYTOCIN 40 UNITS IN LACTATED RINGERS INFUSION - SIMPLE MED
2.5000 [IU]/h | INTRAVENOUS | Status: DC
  Administered 2017-09-23: 2.5 [IU]/h via INTRAVENOUS

## 2017-09-23 MED ORDER — FENTANYL 2.5 MCG/ML BUPIVACAINE 1/10 % EPIDURAL INFUSION (WH - ANES): 14.0000 mL/h | INTRAMUSCULAR | Status: DC | PRN

## 2017-09-23 MED ORDER — HYDROXYZINE HCL 50 MG PO TABS
50.0000 mg | ORAL_TABLET | Freq: Four times a day (QID) | ORAL | Status: DC | PRN
  Filled 2017-09-23: qty 1

## 2017-09-23 MED ORDER — EPHEDRINE 5 MG/ML INJ
10.0000 mg | INTRAVENOUS | Status: DC | PRN
  Filled 2017-09-23: qty 2

## 2017-09-23 MED ORDER — TERBUTALINE SULFATE 1 MG/ML IJ SOLN
0.2500 mg | Freq: Once | INTRAMUSCULAR | Status: DC | PRN
  Filled 2017-09-23: qty 1

## 2017-09-23 MED ORDER — TETANUS-DIPHTH-ACELL PERTUSSIS 5-2.5-18.5 LF-MCG/0.5 IM SUSP: 0.5000 mL | Freq: Once | INTRAMUSCULAR | Status: DC

## 2017-09-23 MED ORDER — BENZOCAINE-MENTHOL 20-0.5 % EX AERO
1.0000 "application " | INHALATION_SPRAY | CUTANEOUS | Status: DC | PRN
  Administered 2017-09-23: 1 via TOPICAL
  Filled 2017-09-23: qty 56

## 2017-09-23 MED ORDER — PRENATAL MULTIVITAMIN CH
1.0000 | ORAL_TABLET | Freq: Every day | ORAL | Status: DC
  Administered 2017-09-24 – 2017-09-25 (×2): 1 via ORAL
  Filled 2017-09-23: qty 1

## 2017-09-23 MED ORDER — TERBUTALINE SULFATE 1 MG/ML IJ SOLN
0.2500 mg | Freq: Once | INTRAMUSCULAR | Status: DC | PRN
Start: 2017-09-23 — End: 2017-09-23
  Filled 2017-09-23: qty 1

## 2017-09-23 MED ORDER — PHENYLEPHRINE 40 MCG/ML (10ML) SYRINGE FOR IV PUSH (FOR BLOOD PRESSURE SUPPORT)
80.0000 ug | PREFILLED_SYRINGE | INTRAVENOUS | Status: DC | PRN
  Filled 2017-09-23: qty 5

## 2017-09-23 MED ORDER — LACTATED RINGERS IV SOLN
INTRAVENOUS | Status: DC
  Administered 2017-09-23 (×2): via INTRAVENOUS

## 2017-09-23 MED ORDER — IBUPROFEN 600 MG PO TABS
600.0000 mg | ORAL_TABLET | Freq: Four times a day (QID) | ORAL | Status: DC
  Administered 2017-09-23 – 2017-09-25 (×8): 600 mg via ORAL
  Filled 2017-09-23 (×7): qty 1

## 2017-09-23 MED ORDER — TETANUS-DIPHTH-ACELL PERTUSSIS 5-2.5-18.5 LF-MCG/0.5 IM SUSP
0.5000 mL | Freq: Once | INTRAMUSCULAR | Status: AC
  Administered 2017-09-25: 0.5 mL via INTRAMUSCULAR
  Filled 2017-09-23: qty 0.5

## 2017-09-23 MED ORDER — BENZOCAINE-MENTHOL 20-0.5 % EX AERO: 1.0000 "application " | INHALATION_SPRAY | CUTANEOUS | Status: DC | PRN

## 2017-09-23 MED ORDER — ONDANSETRON HCL 4 MG PO TABS: 4.0000 mg | ORAL_TABLET | ORAL | Status: DC | PRN

## 2017-09-23 MED ORDER — SENNOSIDES-DOCUSATE SODIUM 8.6-50 MG PO TABS: 2.0000 | ORAL_TABLET | ORAL | Status: DC

## 2017-09-23 MED ORDER — IBUPROFEN 600 MG PO TABS: 600.0000 mg | ORAL_TABLET | Freq: Four times a day (QID) | ORAL | Status: DC

## 2017-09-23 MED ORDER — OXYTOCIN 40 UNITS IN LACTATED RINGERS INFUSION - SIMPLE MED
1.0000 m[IU]/min | INTRAVENOUS | Status: DC
  Administered 2017-09-23: 2 m[IU]/min via INTRAVENOUS
  Filled 2017-09-23: qty 1000

## 2017-09-23 MED ORDER — SIMETHICONE 80 MG PO CHEW: 80.0000 mg | CHEWABLE_TABLET | ORAL | Status: DC | PRN

## 2017-09-23 MED ORDER — SENNOSIDES-DOCUSATE SODIUM 8.6-50 MG PO TABS
2.0000 | ORAL_TABLET | ORAL | Status: DC
  Administered 2017-09-23 – 2017-09-24 (×2): 2 via ORAL
  Filled 2017-09-23 (×2): qty 2

## 2017-09-23 MED ORDER — FENTANYL CITRATE (PF) 100 MCG/2ML IJ SOLN: 50.0000 ug | INTRAMUSCULAR | Status: DC | PRN

## 2017-09-23 MED ORDER — LACTATED RINGERS IV SOLN: 500.0000 mL | Freq: Once | INTRAVENOUS | Status: DC

## 2017-09-23 MED ORDER — ONDANSETRON HCL 4 MG/2ML IJ SOLN: 4.0000 mg | Freq: Four times a day (QID) | INTRAMUSCULAR | Status: DC | PRN

## 2017-09-23 MED ORDER — DIPHENHYDRAMINE HCL 50 MG/ML IJ SOLN: 12.5000 mg | INTRAMUSCULAR | Status: DC | PRN

## 2017-09-23 MED ORDER — LACTATED RINGERS IV SOLN: 500.0000 mL | INTRAVENOUS | Status: DC | PRN

## 2017-09-23 MED ORDER — PRENATAL MULTIVITAMIN CH: 1.0000 | ORAL_TABLET | Freq: Every day | ORAL | Status: DC

## 2017-09-23 NOTE — Progress Notes (Signed)
Theresa David is a 22 y.o. G1P0000 at [redacted]w[redacted]d by LMP admitted for induction of labor due to cholestasis.  Subjective: Painful contractions  Objective: BP 104/64   Pulse (!) 101   Temp 99.4 F (37.4 C) (Oral)   Resp 18   Ht  (1.651 m)   Wt 78.7 kg (173 lb 8 oz)   LMP 01/06/2017 (Exact Date)   SpO2 100%   BMI 28.87 kg/m  No intake/output data recorded. No intake/output data recorded.  FHT:  FHR: 150 bpm, variability: moderate,  accelerations:  Present,  decelerations:  Absent UC:   regular, every 3-5 minutes SVE:   Dilation: 5 Effacement (%): 70 Station: -2 Exam by:: Raliegh Ip RN  Labs: Lab Results  Component Value Date   WBC 8.2 09/23/2017   HGB 11.5 (L) 09/23/2017   HCT 34.6 (L) 09/23/2017   MCV 95.1 09/23/2017   PLT 265 09/23/2017    Assessment / Plan: Induction of labor due to cholestasis,  progressing well on pitocin  Labor: Progressing normally and will start Pitocin @ 1030 AM Fetal Wellbeing:  Category I Pain Control:  IV pain meds I/D:  n/a Anticipated MOD:  NSVD  Felisa Bonier, MD, PGY-1 Family Medicine- [facility] [place] 09/23/2017, 10:22 AM

## 2017-09-23 NOTE — Progress Notes (Signed)
Theresa David is a 22 y.o. G1P0000 at [redacted]w[redacted]d by LMP admitted for induction of labor due to cholestasis.  Subjective: Painful contractions and increased vaginal pressure. Mild bleeding  Objective: BP 138/82   Pulse 71   Temp 98 F (36.7 C) (Oral)   Resp 18   Ht  (1.651 m)   Wt 78.7 kg (173 lb 8 oz)   LMP 01/06/2017 (Exact Date)   SpO2 100%   BMI 28.87 kg/m  No intake/output data recorded. No intake/output data recorded.  FHT:  FHR: 130 bpm, variability: moderate,  accelerations:  Present,  decelerations:  Absent UC:   regular, every 3-4 minutes SVE:   Dilation: 5.5 Effacement (%): 90 Station: -1 Exam by:: Carloyn Jaeger CNM  Labs: Lab Results  Component Value Date   WBC 8.2 09/23/2017   HGB 11.5 (L) 09/23/2017   HCT 34.6 (L) 09/23/2017   MCV 95.1 09/23/2017   PLT 265 09/23/2017    Assessment / Plan: Induction of labor due to cholestasis,  progressing well on pitocin  Labor: AROM @ 1415,clear fluid. Will continue Pitocin Fetal Wellbeing:  Category I Pain Control:  IV pain meds, declined Epidural I/D:  n/a Anticipated MOD:  NSVD  Felisa Bonier, MD, PGY-1 Family Medicine - [facility] [place] 09/23/2017, 2:24 PM

## 2017-09-23 NOTE — Anesthesia Pain Management Evaluation Note (Signed)
  CRNA Pain Management Visit Note  Patient: Theresa David, 22 y.o., female  "Hello I am a member of the anesthesia team at [facility]. We have an anesthesia team available at all times to provide care throughout the hospital, including epidural management and anesthesia for C-section. I don't know your plan for the delivery whether it a natural birth, water birth, IV sedation, nitrous supplementation, doula or epidural, but we want to meet your pain goals."   1.Was your pain managed to your expectations on prior hospitalizations?   No prior hospitalizations  2.What is your expectation for pain management during this hospitalization?     Labor support without medications and IV pain meds  3.How can we help you reach that goal? Pt does not want an epidural at this time  Record the patient's initial score and the patient's pain goal.   Pain: 0  Pain Goal: 6 [facility] wants you to be able to say your pain was always managed very well.  Theresa David 09/23/2017

## 2017-09-23 NOTE — Progress Notes (Signed)
Patient ID: Theresa David, female   DOB: 10-04-1995, 22 y.o.   MRN: [id number] Theresa David is a 22 y.o. G1P0000 at [redacted]w[redacted]d admitted for induction of labor due to cholestasis.  Subjective: Comfortable, foley bulb out  Objective: BP 125/77   Pulse 65   Temp 99.4 F (37.4 C) (Oral)   Resp 20   Ht  (1.651 m)   Wt 78.7 kg (173 lb 8 oz)   LMP 01/06/2017 (Exact Date)   SpO2 100%   BMI 28.87 kg/m  No intake/output data recorded.  FHT:  FHR: 145 bpm, variability: moderate,  accelerations:  Present,  decelerations:  Absent UC:   q 1-43mins  SVE:   Dilation: 4 Effacement (%): 60 Station: -3 Exam by:: Raliegh Ip RN  Labs: Lab Results  Component Value Date   WBC 8.2 09/23/2017   HGB 11.5 (L) 09/23/2017   HCT 34.6 (L) 09/23/2017   MCV 95.1 09/23/2017   PLT 265 09/23/2017    Assessment / Plan: IOL d/t ICP, s/p cytotec x 2, foley bulb now out, plan to start pitocin if needed 4hr from last cytotec  Labor: s/p cervical ripening Fetal Wellbeing:  Category I Pain Control:  n/a Pre-eclampsia: n/a I/D:  n/a Anticipated MOD:  NSVD  Cheral Marker CNM, WHNP-BC 09/23/2017, [phone]

## 2017-09-23 NOTE — Plan of Care (Signed)
Patient is progressing.  

## 2017-09-23 NOTE — Progress Notes (Signed)
Patient ID: Theresa David, female   DOB: May 10, 1996, 22 y.o.   MRN: [id number] Frimet Durfee is a 22 y.o. G1P0000 at [redacted]w[redacted]d admitted for induction of labor due to cholestasis of pregnancy.  Subjective: No complaints  Objective: BP 126/82   Pulse 72   Temp 98.9 F (37.2 C) (Oral)   Resp 20   Ht  (1.651 m)   Wt 78.7 kg (173 lb 8 oz)   LMP 01/06/2017 (Exact Date)   SpO2 100%   BMI 28.87 kg/m  No intake/output data recorded.  FHT:  FHR: 150 bpm, variability: moderate,  accelerations:  Present,  decelerations:  Absent UC:   q 2-13mins  SVE:   Dilation: 1.5 Effacement (%): Thick Station: -3 Exam by:: Burak Zerbe,cnm  GBS PCR collected Cervical foley bulb inserted and inflated w/ 60ml LR w/o difficulty   Labs: Lab Results  Component Value Date   WBC 8.2 09/23/2017   HGB 11.5 (L) 09/23/2017   HCT 34.6 (L) 09/23/2017   MCV 95.1 09/23/2017   PLT 265 09/23/2017    Assessment / Plan: IOL d/t ICP, s/p cytotec, now w/ foley bulb placed, continue cytotec q4hr as needed while foley bulb in  Labor: cervical ripening phase Fetal Wellbeing:  Category I Pain Control:  n/a Pre-eclampsia: n/a I/D:  GBS pcr collected Anticipated MOD:  NSVD  Cheral Marker CNM, WHNP-BC 09/23/2017, 2:39 AM

## 2017-09-23 NOTE — H&P (Signed)
Theresa David is a 22 y.o. G1P0000 female at [redacted]w[redacted]d by LMP c/w 7wk u/s presenting for IOL d/t cholestasis of pregnancy.   Reports active fetal movement, contractions: none, vaginal bleeding: none, membranes: intact. Initiated prenatal care at [facility] at 9 wks.    This pregnancy complicated by: Cholestasis dx this week, anemia  Prenatal History/Complications:  G1  Past Medical History: Past Medical History:  Diagnosis Date  . Asthma   . Bacterial vaginosis   . Eczema 09/06/2012  . Pain with urination 04/26/2015  . Seasonal allergies 09/06/2012  . Unspecified asthma(493.90) 09/06/2012  . Urinary frequency 08/01/2013  . UTI (lower urinary tract infection) 08/01/2013    Past Surgical History: No past surgical history on file.  Obstetrical History: OB History    Gravida  1   Para  0   Term  0   Preterm  0   AB  0   Living  0     SAB  0   TAB  0   Ectopic  0   Multiple  0   Live Births              Social History: Social History   Socioeconomic History  . Marital status: Single    Spouse name: Not on file  . Number of children: Not on file  . Years of education: Not on file  . Highest education level: Not on file  Occupational History  . Not on file  Social Needs  . Financial resource strain: Not on file  . Food insecurity:    Worry: Not on file    Inability: Not on file  . Transportation needs:    Medical: Not on file    Non-medical: Not on file  Tobacco Use  . Smoking status: Never Smoker  . Smokeless tobacco: Never Used  Substance and Sexual Activity  . Alcohol use: No  . Drug use: No  . Sexual activity: Yes    Birth control/protection: None  Lifestyle  . Physical activity:    Days per week: Not on file    Minutes per session: Not on file  . Stress: Not on file  Relationships  . Social connections:    Talks on phone: Not on file    Gets together: Not on file    Attends religious service: Not on file    Active member of club or  organization: Not on file    Attends meetings of clubs or organizations: Not on file    Relationship status: Not on file  Other Topics Concern  . Not on file  Social History Narrative   Attends college. Wants to be pediatric nurse   Lives with mom    No  smokers    Family History: Family History  Problem Relation Age of Onset  . Cancer Maternal Aunt 44       breast   . Dementia Maternal Grandmother     Allergies: Allergies  Allergen Reactions  . Shellfish Allergy Anaphylaxis and Rash    SEAFOOD    Medications Prior to Admission  Medication Sig Dispense Refill Last Dose  . albuterol (PROAIR HFA) 108 (90 Base) MCG/ACT inhaler Inhale 2 puffs into the lungs every 4 (four) hours as needed for wheezing (Use with spacer). (Patient not taking: Reported on 09/21/2017) 18 g 0 Not Taking  . Doxylamine-Pyridoxine 10-10 MG TBEC 2 PO qhs; may take 1po in am and 1po in afternoon prn nausea (Patient not taking: Reported on 08/28/2017) 120 tablet  3 Not Taking  . EPINEPHrine (EPIPEN) 0.3 mg/0.3 mL DEVI Inject 0.3 mLs (0.3 mg total) into the muscle once. (Patient not taking: Reported on 09/21/2017) 2 Device 1 Not Taking  . ferrous sulfate 325 (65 FE) MG tablet Take 1 tablet (325 mg total) by mouth 2 (two) times daily with a meal. (Patient not taking: Reported on 09/21/2017) 60 tablet 3 Not Taking    Review of Systems  Pertinent pos/neg as indicated in HPI  Last menstrual period 01/06/2017. General appearance: alert, cooperative and no distress Lungs: clear to auscultation bilaterally Heart: regular rate and rhythm Abdomen: gravid, soft, non-tender Extremities: tr edema DTR's 2+  Fetal monitoring: FHR: 150 bpm, variability: moderate,  Accelerations: Present,  decelerations:  Absent Uterine activity: q 2-74mins   Presentation: cephalic   Prenatal labs: ABO, Rh: B/Positive/-- (10/24 1139) Antibody: Negative (03/01 0924) Rubella: 3.57 (10/24 1139) RPR: Non Reactive (03/01 0924)  HBsAg:  Negative (10/24 1139)  HIV: Non Reactive (03/01 0924)  GBS:  cx pending  2hr GTT: 70/109/61 Genetic screening:  neg Anatomy US: normal female  No results found for this or any previous visit (from the past 24 hour(s)).   Assessment:  [redacted]w[redacted]d SIUP  G1P0000  IOL for ICP  Cat 1 FHR  GBS  cx pending  Plan:  Admit to [facility]  IV pain meds/epidural prn active labor  Cytotec>foley bulb  Anticipate NSVB   Plans to breastfeed  Contraception: undecided  Circumcision: at [facility]  Cheral Marker CNM, WHNP-BC 09/23/2017, 12:24 AM

## 2017-09-24 NOTE — Lactation Note (Signed)
This note was copied from a baby's chart. Lactation Consultation Note  Patient Name: Theresa David [id number] Date: 09/24/2017 Reason for consult: Initial assessment;Primapara;1st time breastfeeding;Early term 37-38.6wks;Infant < 6lbs  P1 mother whose infant is now 92 hours old.  This is an ETI who is < 6 lbs.  RN and mother state that infant has been doing well with breastfeeding.  Mother's breasts are soft and non tender with erect nipples.  She feels like the latch has been good and infant actively sucks at breast.  Encouraged a lot of STS, breast massage and hand expression and to watch infant for feeding cues.  Knowing that baby may not awaken on his own, I reminded mother to awaken every 3 hours for feeding.  Mother verbalized understanding.  Initiated mother pumping with a DEBP for increased milk volume for supplementation for baby.  Instructions provided for assembly and cleaning of pump parts.  Provided a small plastic container for any EBM she may obtain and a spoon for spoon feeding.  Mother will  Call for assistance as needed.  FOB holding infant and baby is asleep.  Mom made aware of O/P services, breastfeeding support groups, community resources, and our phone # for post-discharge questions.   Maternal Data Formula Feeding for Exclusion: No Has patient been taught Hand Expression?: Yes Does the patient have breastfeeding experience prior to this delivery?: No  Feeding Feeding Type: Breast Fed Length of feed: 20 min  LATCH Score                   Interventions    Lactation Tools Discussed/Used Pump Review: Setup, frequency, and cleaning;Milk Storage Initiated by:: Robina Hamor Date initiated:: 09/24/17   Consult Status Consult Status: Follow-up Date: 09/24/17 Follow-up type: In-patient    Theresa David [name] 09/24/2017, 12:18 AM

## 2017-09-24 NOTE — Progress Notes (Addendum)
Post Partum Day 1 Subjective: no complaints, up ad lib, tolerating PO and + flatus  Theresa David is a 22 y/o G1P1001 PPD1 s/p SVD at [redacted]w[redacted]d gestation. Patient is doing well with no complaints. No significant events overnight. Patient is tolerating po well, ambulating without difficulty, and admits to passing flatulence, but no BM. Patient denies chest pain, SOB, and calf pain. Pain is well controlled. Lochia is appropriate.  Objective: Blood pressure 93/62, pulse 61, temperature 98.3 F (36.8 C), temperature source Axillary, resp. rate 16, height  (1.651 m), weight 78.7 kg (173 lb 8 oz), last menstrual period 01/06/2017, SpO2 100 %, unknown if currently breastfeeding.  Physical Exam:  General: alert, cooperative, appears stated age and no distress  Chest: Clear to auscultation bilaterally Heart: Regular rate and rhythm Abdomen: Soft, nontender Lochia: appropriate Uterine Fundus: firm Incision: N/A DVT Evaluation: No evidence of DVT seen on physical exam.  Recent Labs    09/23/17 0101  HGB 11.5*  HCT 34.6*    Assessment/Plan: Plan for discharge tomorrow, Discharge home, Breastfeeding and Contraception Nexplanon  Plan to discharge tomorrow pending patient progress Contraception: Nexplanon Feeding: Breastfeeding Circumcision: Outpatient    LOS: 1 day   Caprice Renshaw, PA-S 09/24/2017, 7:42 AM   Midwife attestation Post Partum Day 1 I have seen and examined this patient and agree with above documentation in the student's note.   Theresa David is a 22 y.o. G1P1001 s/p SVD.  Pt denies problems with ambulating, voiding or po intake. Pain is well controlled. Method of Feeding: breast  PE:  Gen: well appearing Heart: reg rate Lungs: normal WOB Fundus firm Ext: soft, no pain, no edema  Assessment: S/p SVD PPD #1  Plan for discharge: tomorrow  Donette Larry, CNM 12:44 PM

## 2017-09-24 NOTE — Lactation Note (Signed)
This note was copied from a baby's chart. Lactation Consultation Note  Patient Name: Theresa David RCVEL'F Date: 09/24/2017 Reason for consult: Follow-up assessment   Baby 24 hours old < 6 lbs.  [redacted]w[redacted]d. Mother states baby recently bf for 15 min. Mother has pumped 5 times in 24 hours. Suggest that she give baby additional colostrum with each feeding. Reviewed hand expression and gave baby drops on spoon. Encouraged mother to pump w/ DEBP. Recommend mother post pump 6-8 times per day for 10-20 min with DEBP on initiation setting. Give baby back volume pumped at the next feeding.   Limit feedings to 30 min.    Maternal Data Has patient been taught Hand Expression?: Yes  Feeding Feeding Type: Breast Fed Length of feed: 15 min  LATCH Score                   Interventions    Lactation Tools Discussed/Used     Consult Status Consult Status: Follow-up Date: 09/25/17 Follow-up type: In-patient    Dahlia Byes [facility] 09/24/2017, 4:51 PM

## 2017-09-25 LAB — BIRTH TISSUE RECOVERY COLLECTION (PLACENTA DONATION)

## 2017-09-25 MED ORDER — IBUPROFEN 600 MG PO TABS: 600.0000 mg | ORAL_TABLET | Freq: Four times a day (QID) | ORAL | 0 refills | Status: DC

## 2017-09-25 NOTE — Lactation Note (Signed)
This note was copied from a baby's chart. Lactation Consultation Note  Patient Name: Theresa David BJYNW'G Date: 09/25/2017  [name] is 44 hours old and at a 7% weight loss.  Baby has been cluster feeding.  Mom reports he latches easily.  Instructed on breast massage/compression during feedings.  Discussed milk coming to volume and engorgement prevention and treatment.  Encouraged to call with concerns/assist prn.   Maternal Data    Feeding Feeding Type: Breast Fed Length of feed: 12 min  LATCH Score                   Interventions    Lactation Tools Discussed/Used     Consult Status      Huston Foley 09/25/2017, 9:48 AM

## 2017-09-25 NOTE — Discharge Instructions (Signed)
Vaginal Delivery, Care After °Refer to this sheet in the next few [place]. These instructions provide you with information about caring for yourself after vaginal delivery. Your health care provider may also give you more specific instructions. Your treatment has been planned according to current medical practices, but problems sometimes occur. Call your health care provider if you have any problems or questions. °What can I expect after the procedure? °After vaginal delivery, it is common to have: °· Some bleeding from your vagina. °· Soreness in your abdomen, your vagina, and the area of skin between your vaginal opening and your anus (perineum). °· Pelvic cramps. °· Fatigue. ° °Follow these instructions at home: °Medicines °· Take over-the-counter and prescription medicines only as told by your health care provider. °· If you were prescribed an antibiotic medicine, take it as told by your health care provider. Do not stop taking the antibiotic until it is finished. °Driving ° °· Do not drive or operate heavy machinery while taking prescription pain medicine. °· Do not drive for 24 hours if you received a sedative. °Lifestyle °· Do not drink alcohol. This is especially important if you are breastfeeding or taking medicine to relieve pain. °· Do not use tobacco products, including cigarettes, chewing tobacco, or e-cigarettes. If you need help quitting, ask your health care provider. °Eating and drinking °· Drink at least 8 eight-ounce glasses of water every day unless you are told not to by your health care provider. If you choose to breastfeed your baby, you may need to drink more water than this. °· Eat high-fiber foods every day. These foods may help prevent or relieve constipation. High-fiber foods include: °? Whole grain cereals and breads. °? Brown rice. °? Beans. °? Fresh fruits and vegetables. °Activity °· Return to your normal activities as told by your health care provider. Ask your health care provider  what activities are safe for you. °· Rest as much as possible. Try to rest or take a nap when your baby is sleeping. °· Do not lift anything that is heavier than your baby or 10 lb (4.5 kg) until your health care provider says that it is safe. °· Talk with your health care provider about when you can engage in sexual activity. This may depend on your: °? Risk of infection. °? Rate of healing. °? Comfort and desire to engage in sexual activity. °Vaginal Care °· If you have an episiotomy or a vaginal tear, check the area every day for signs of infection. Check for: °? More redness, swelling, or pain. °? More fluid or blood. °? Warmth. °? Pus or a bad smell. °· Do not use tampons or douches until your health care provider says this is safe. °· Watch for any blood clots that may pass from your vagina. These may look like clumps of dark red, brown, or black discharge. °General instructions °· Keep your perineum clean and dry as told by your health care provider. °· Wear loose, comfortable clothing. °· Wipe from front to back when you use the toilet. °· Ask your health care provider if you can shower or take a bath. If you had an episiotomy or a perineal tear during labor and delivery, your health care provider may tell you not to take baths for a certain length of time. °· Wear a bra that supports your breasts and fits you well. °· If possible, have someone help you with household activities and help care for your baby for at least a few days after   you leave the hospital. °· Keep all follow-up visits for you and your baby as told by your health care provider. This is important. °Contact a health care provider if: °· You have: °? Vaginal discharge that has a bad smell. °? Difficulty urinating. °? Pain when urinating. °? A sudden increase or decrease in the frequency of your bowel movements. °? More redness, swelling, or pain around your episiotomy or vaginal tear. °? More fluid or blood coming from your episiotomy or  vaginal tear. °? Pus or a bad smell coming from your episiotomy or vaginal tear. °? A fever. °? A rash. °? Little or no interest in activities you used to enjoy. °? Questions about caring for yourself or your baby. °· Your episiotomy or vaginal tear feels warm to the touch. °· Your episiotomy or vaginal tear is separating or does not appear to be healing. °· Your breasts are painful, hard, or turn red. °· You feel unusually sad or worried. °· You feel nauseous or you vomit. °· You pass large blood clots from your vagina. If you pass a blood clot from your vagina, save it to show to your health care provider. Do not flush blood clots down the toilet without having your health care provider look at them. °· You urinate more than usual. °· You are dizzy or light-headed. °· You have not breastfed at all and you have not had a menstrual period for 12 Friedly after delivery. °· You have stopped breastfeeding and you have not had a menstrual period for 12 Goswami after you stopped breastfeeding. °Get help right away if: °· You have: °? Pain that does not go away or does not get better with medicine. °? Chest pain. °? Difficulty breathing. °? Blurred vision or spots in your vision. °? Thoughts about hurting yourself or your baby. °· You develop pain in your abdomen or in one of your legs. °· You develop a severe headache. °· You faint. °· You bleed from your vagina so much that you fill two sanitary pads in one hour. °This information is not intended to replace advice given to you by your health care provider. Make sure you discuss any questions you have with your health care provider. °Document Released: 05/02/2000 Document Revised: 10/17/2015 Document Reviewed: 05/20/2015 °Elsevier Interactive Patient Education © 2018 Elsevier Inc. ° °

## 2017-09-25 NOTE — Discharge Summary (Addendum)
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 yo G1P1001 at [redacted]w[redacted]d was admitted to the hospital 09/23/2017 for induction of labor.  Indication for induction: Cholestasis of pregnancy.  Patient had an uncomplicated labor course as follows: Membrane Rupture Time/Date: 2:14 PM ,09/23/2017   Intrapartum Procedures: Episiotomy: None [1]                                         Lacerations:  1st degree [2];Periurethral [8];Vaginal [6]  Patient had delivery of a Viable infant.  Information for the patient's newborn:  Mellina, Benison [[id number]]  Delivery Method: Vaginal, Spontaneous(Filed from Delivery Summary)   09/23/2017  Details of delivery can be found in separate delivery note.  Patient had a routine postpartum course. Patient is discharged home 09/25/17.  Physical exam  Vitals:   09/23/17 2340 09/24/17 0556 09/24/17 1838 09/25/17 0555  BP: 121/73 93/62 112/76 116/68  Pulse: 72 61 69 64  Resp: Temp: 98.8 F (37.1 C) 98.3 F (36.8 C) 98.8 F (37.1 C) 97.9 F (36.6 C)  TempSrc: Axillary Axillary Oral Oral  SpO2:      Weight:      Height:       General: alert, cooperative and no distress Lochia: appropriate Uterine Fundus: firm Incision: N/A DVT Evaluation: No evidence of DVT seen on  physical exam. Negative Homan's sign. Labs: Lab Results  Component Value Date   WBC 8.2 09/23/2017   HGB 11.5 (L) 09/23/2017   HCT 34.6 (L) 09/23/2017   MCV 95.1 09/23/2017   PLT 265 09/23/2017   CMP Latest Ref Rng & Units 09/17/2017  Glucose 65 - 99 mg/dL 70  BUN 6 - 20 mg/dL 5(L)  Creatinine 7.82 - 1.00 mg/dL 9.56  Sodium 213 - 086 mmol/L 137  Potassium 3.5 - 5.2 mmol/L 3.8  Chloride 96 - 106 mmol/L 103  CO2 20 - 29 mmol/L 20  Calcium 8.7 - 10.2 mg/dL 8.8  Total Protein 6.0 - 8.5 g/dL 6.6  Total Bilirubin 0.0 - 1.2 mg/dL 0.4  Alkaline Phos 39 - 117 IU/L 189(H)  AST 0 - 40 IU/L 18  ALT 0 - 32 IU/L 8    Discharge instruction: per After Visit Summary and "Baby and Me Booklet".  After visit  meds:  Allergies as of 09/25/2017      Reactions   Shellfish Allergy Anaphylaxis, Rash   SEAFOOD      Medication List    STOP taking these medications   ferrous sulfate 325 (65 FE) MG tablet     TAKE these medications   albuterol 108 (90 Base) MCG/ACT inhaler Commonly known as:  PROAIR HFA Inhale 2 puffs into the lungs every 4 (four) hours as needed for wheezing (Use with spacer).   Doxylamine-Pyridoxine 10-10 MG Tbec 2 PO qhs; may take 1po in am and 1po in afternoon prn nausea   EPINEPHrine 0.3 mg/0.3 mL Devi Commonly known as:  EPIPEN Inject 0.3 mLs (0.3 mg total) into the muscle once.   ibuprofen 600 MG tablet Commonly known as:  ADVIL,MOTRIN Take 1 tablet (600 mg total) by mouth every 6 (six) hours.       Diet: routine diet  Activity: Advance as tolerated. Pelvic rest for 6 Henrickson.   Outpatient follow up:6 Yanes Follow up Appt: Future Appointments  Date Time Provider Department Center  10/26/2017 10:15 AM Cheral Marker, CNM [facility]   Follow up Visit:No follow-ups on file.  Postpartum contraception: Undecided  Newborn Data: Live born female  Birth Weight: 5 lb 12.1 oz (2610 g) APGAR: 8, 9  Newborn Delivery   Birth date/time:  09/23/2017  16:14:00 Delivery type:  Vaginal, Spontaneous     Baby Feeding: Bottle Disposition:home with mother   09/25/2017 Myrene Buddy, MD  OB FELLOW DISCHARGE ATTESTATION  I have seen and examined this patient. I agree with above documentation and have made edits as needed.   Caryl Ada, DO OB Fellow

## 2017-09-26 ENCOUNTER — Ambulatory Visit: Payer: Self-pay

## 2017-09-26 NOTE — Lactation Note (Signed)
This note was copied from a baby's chart. Lactation Consultation Note  Infant is 66 hours old and is at 10% weight loss . Mother has been cue base feeding. She was assisted with hand expression and post pumping . She obtained 6 ml.   Parents were given LPI instruction sheet and discussed supplementing after each feeding. Mother reports that infant was on the breast for 60 mins. Advised mother to breastfeed, but limit to 30 mins of good suckling, then pump and  supplement. Discussed  using ebm and formula to add additional calories with each feeding.   Infant was given 6 ml with gloved finger and curved tip syringe.  Mother advised to hand express, pump for 15-20 mins and hand express again.   Mother to page [facility] for assistance with next feeding. Discussed different methods of supplementing.  Mother was given a #5 fr feeding tube and LC will assist with feeding and supplementing next feeding. Advised mother to offer infant at least 20-30 ml after each feeding.  Mothers breast are filling and she can easily express milk. Infant is skin to skin with father at this time and is content.   Patient Name: Theresa David ZOXWR'U Date: 09/26/2017 Reason for consult: Follow-up assessment   Maternal Data    Feeding Feeding Type: Breast Milk Length of feed: 60 min(per mom)  LATCH Score Latch: Grasps breast easily, tongue down, lips flanged, rhythmical sucking.  Audible Swallowing: Spontaneous and intermittent  Type of Nipple: Everted at rest and after stimulation  Comfort (Breast/Nipple): Soft / non-tender  Hold (Positioning): Assistance needed to correctly position infant at breast and maintain latch.  LATCH Score: 9  Interventions    Lactation Tools Discussed/Used     Consult Status Consult Status: Follow-up Date: 09/26/17 Follow-up type: In-patient    Stevan [facility] 09/26/2017, 10:41 AM

## 2017-10-26 ENCOUNTER — Encounter: Payer: Self-pay | Admitting: Women's Health

## 2017-10-26 ENCOUNTER — Ambulatory Visit (INDEPENDENT_AMBULATORY_CARE_PROVIDER_SITE_OTHER): Payer: Medicaid Other | Admitting: Women's Health

## 2017-10-26 DIAGNOSIS — Z30011 Encounter for initial prescription of contraceptive pills: Secondary | ICD-10-CM

## 2017-10-26 MED ORDER — NORETHINDRONE 0.35 MG PO TABS: 1.0000 | ORAL_TABLET | Freq: Every day | ORAL | 11 refills | Status: DC

## 2017-10-26 NOTE — Patient Instructions (Signed)
Norethindrone tablets (contraception) What is this medicine? NORETHINDRONE (nor eth IN drone) is an oral contraceptive. The product contains a female hormone known as a progestin. It is used to prevent pregnancy. This medicine may be used for other purposes; ask your health care provider or pharmacist if you have questions. COMMON BRAND NAME(S): Camila, Deblitane 28-Day, Errin, Heather, Jencycla, Jolivette, Lyza, Nor-QD, Nora-BE, Norlyroc, Ortho Micronor, Sharobel 28-Day What should I tell my health care provider before I take this medicine? They need to know if you have any of these conditions: -blood vessel disease or blood clots -breast, cervical, or vaginal cancer -diabetes -heart disease -kidney disease -liver disease -mental depression -migraine -seizures -stroke -vaginal bleeding -an unusual or allergic reaction to norethindrone, other medicines, foods, dyes, or preservatives -pregnant or trying to get pregnant -breast-feeding How should I use this medicine? Take this medicine by mouth with a glass of water. You may take it with or without food. Follow the directions on the prescription label. Take this medicine at the same time each day and in the order directed on the package. Do not take your medicine more often than directed. Contact your pediatrician regarding the use of this medicine in children. Special care may be needed. This medicine has been used in female children who have started having menstrual periods. A patient package insert for the product will be given with each prescription and refill. Read this sheet carefully each time. The sheet may change frequently. Overdosage: If you think you have taken too much of this medicine contact a poison control center or emergency room at once. NOTE: This medicine is only for you. Do not share this medicine with others. What if I miss a dose? Try not to miss a dose. Every time you miss a dose or take a dose late your chance of  pregnancy increases. When 1 pill is missed (even if only 3 hours late), take the missed pill as soon as possible and continue taking a pill each day at the regular time (use a back up method of birth control for the next 48 hours). If more than 1 dose is missed, use an additional birth control method for the rest of your pill pack until menses occurs. Contact your health care professional if more than 1 dose has been missed. What may interact with this medicine? Do not take this medicine with any of the following medications: -amprenavir or fosamprenavir -bosentan This medicine may also interact with the following medications: -antibiotics or medicines for infections, especially rifampin, rifabutin, rifapentine, and griseofulvin, and possibly penicillins or tetracyclines -aprepitant -barbiturate medicines, such as phenobarbital -carbamazepine -felbamate -modafinil -oxcarbazepine -phenytoin -ritonavir or other medicines for HIV infection or AIDS -St. John's wort -topiramate This list may not describe all possible interactions. Give your health care provider a list of all the medicines, herbs, non-prescription drugs, or dietary supplements you use. Also tell them if you smoke, drink alcohol, or use illegal drugs. Some items may interact with your medicine. What should I watch for while using this medicine? Visit your doctor or health care professional for regular checks on your progress. You will need a regular breast and pelvic exam and Pap smear while on this medicine. Use an additional method of birth control during the first cycle that you take these tablets. If you have any reason to think you are pregnant, stop taking this medicine right away and contact your doctor or health care professional. If you are taking this medicine for hormone related problems, it   may take several cycles of use to see improvement in your condition. This medicine does not protect you against HIV infection (AIDS)  or any other sexually transmitted diseases. What side effects may I notice from receiving this medicine? Side effects that you should report to your doctor or health care professional as soon as possible: -breast tenderness or discharge -pain in the abdomen, chest, groin or leg -severe headache -skin rash, itching, or hives -sudden shortness of breath -unusually weak or tired -vision or speech problems -yellowing of skin or eyes Side effects that usually do not require medical attention (report to your doctor or health care professional if they continue or are bothersome): -changes in sexual desire -change in menstrual flow -facial hair growth -fluid retention and swelling -headache -irritability -nausea -weight gain or loss This list may not describe all possible side effects. Call your doctor for medical advice about side effects. You may report side effects to FDA at 1-800-FDA-1088. Where should I keep my medicine? Keep out of the reach of children. Store at room temperature between 15 and 30 degrees C (59 and 86 degrees F). Throw away any unused medicine after the expiration date. NOTE: This sheet is a summary. It may not cover all possible information. If you have questions about this medicine, talk to your doctor, pharmacist, or health care provider.  2018 Elsevier/Gold Standard (2012-01-23 16:41:35)  

## 2017-10-26 NOTE — Progress Notes (Signed)
   POSTPARTUM VISIT Patient name: Theresa David MRN [id number]  Date of birth: 05-23-1995 Chief Complaint:   Postpartum Care  History of Present Illness:   Theresa David is a 22 y.o. 631P1001 African American female being seen today for a postpartum visit. She is 4 [place] postpartum following a spontaneous vaginal delivery at 37.1 gestational [place] after IOL d/t ICP. Anesthesia: none. I have fully reviewed the prenatal and intrapartum course. Pregnancy complicated by ICP. Postpartum course has been uncomplicated. Bleeding thin lochia. Bowel function is normal. Bladder function is normal.  Patient is not sexually active. Last sexual activity: prior to birth of baby.  Contraception method is wants POPs.  [place] Postpartum Depression Screening: negative. Score 5.   Last pap <21yo.  Results were n/a .  Patient's last menstrual period was 01/06/2017 (exact date).  Baby's course has been uncomplicated. Baby is feeding by breast.  Review of Systems:   Pertinent items are noted in HPI Denies Abnormal vaginal discharge w/ itching/odor/irritation, headaches, visual changes, shortness of breath, chest pain, abdominal pain, severe nausea/vomiting, or problems with urination or bowel movements. Pertinent History Reviewed:  Reviewed past medical,surgical, obstetrical and family history.  Reviewed problem list, medications and allergies. OB History  Gravida Para Term Preterm AB Living  1 1 1  0 0 1  SAB TAB Ectopic Multiple Live Births  0 0 0 0 1    # Outcome Date GA Lbr Len/2nd Weight Sex Delivery Anes PTL Lv  1 Term 09/23/17 [id number] 00:32 / 00:02 5 lb 12.1 oz (2.61 kg) M Vag-Spont Local  LIV   Physical Assessment:   Vitals:   10/26/17 1032  BP: 111/64  Pulse: 69  Weight: 152 lb 6.4 oz (69.1 kg)  Height: 5\' 5"  (1.651 m)  Body mass index is 25.36 kg/m.       Physical Examination:   General appearance: alert, well appearing, and in no distress  Mental status: alert, oriented to person,  place, and time  Skin: warm & dry   Cardiovascular: normal heart rate noted   Respiratory: normal respiratory effort, no distress   Breasts: deferred, no complaints   Abdomen: soft, non-tender   Pelvic: VULVA: normal appearing vulva with no masses, tenderness or lesions, UTERUS: uterus is normal size, shape, consistency and nontender  Rectal: no hemorrhoids  Extremities: no edema       No results found for this or any previous visit (from the past 24 hour(s)).  Assessment & Plan:  1) Postpartum exam 2) 4 wks s/p SVB after IOL for ICP 3) Breastfeeding 4) Depression screening 5) Contraception counseling, pt prefers oral progesterone-only contraceptive, Rx micronor w/ 11RF, understands has to take at exact same time daily to be effective, if late taking use condom as back-up   Meds:  Meds ordered this encounter  Medications  . norethindrone (MICRONOR,CAMILA,ERRIN) 0.35 MG tablet    Sig: Take 1 tablet (0.35 mg total) by mouth daily.    Dispense:  1 Package    Refill:  11    Order Specific Question:   Supervising Provider    Answer:   Lazaro ArmsEURE, LUTHER H [[id number]]    Follow-up: Return for after 9/29 for , Pap & physical.   No orders of the defined types were placed in this encounter.   Cheral MarkerKimberly R Ynez Eugenio CNM, [facility]NP-BC 10/26/2017 11:02 AM

## 2018-12-22 ENCOUNTER — Other Ambulatory Visit (HOSPITAL_COMMUNITY)
Admission: RE | Admit: 2018-12-22 | Discharge: 2018-12-22 | Disposition: A | Discharge status: Home / Self Care | Payer: Medicaid Other | Source: Ambulatory Visit | Attending: Adult Health | Admitting: Adult Health

## 2018-12-22 ENCOUNTER — Ambulatory Visit (INDEPENDENT_AMBULATORY_CARE_PROVIDER_SITE_OTHER): Payer: Medicaid Other | Admitting: Adult Health

## 2018-12-22 ENCOUNTER — Encounter: Payer: Self-pay | Admitting: Adult Health

## 2018-12-22 ENCOUNTER — Other Ambulatory Visit: Payer: Self-pay

## 2018-12-22 VITALS — BP 118/81 | HR 77 | Ht 65.0 in | Wt 138.0 lb

## 2018-12-22 DIAGNOSIS — Z0000 Encounter for general adult medical examination without abnormal findings: Secondary | ICD-10-CM

## 2018-12-22 DIAGNOSIS — Z01419 Encounter for gynecological examination (general) (routine) without abnormal findings: Secondary | ICD-10-CM | POA: Insufficient documentation

## 2018-12-22 NOTE — Progress Notes (Signed)
Patient ID: Theresa David, female   DOB: December 27, 1995, 23 y.o.   MRN: [id number] History of Present Illness: [name] is a 23 year old black female, single, G1P1 in for a well woman gyn exam and first pap. She is in stable relationship and they live together and are getting a house.   Current Medications, Allergies, Past Medical History, Past Surgical History, Family History and Social History were reviewed in [employer] record.     Review of Systems: Patient denies any headaches, hearing loss, fatigue, blurred vision, shortness of breath, chest pain, abdominal pain, problems with bowel movements, urination, or intercourse. No joint pain or mood swings. She declines birth control.     Physical Exam:BP 118/81 (BP Location: Right Arm, Patient Position: Sitting, Cuff Size: Normal)   Pulse 77   Ht 5\' 5"  (1.651 m)   Wt 138 lb (62.6 kg)   LMP 11/22/2018   BMI 22.96 kg/m  General:  Well developed, well nourished, no acute distress Skin:  Warm and dry Neck:  Midline trachea, normal thyroid, good ROM, no lymphadenopathy Lungs; Clear to auscultation bilaterally Breast:  No dominant palpable mass, retraction, or nipple discharge Cardiovascular: Regular rate and rhythm Abdomen:  Soft, non tender, no hepatosplenomegaly,navel pierced  Pelvic:  External genitalia is normal in appearance, no lesions.  The vagina is normal in appearance.Has white discharge, no odor. Urethra has no lesions or masses. The cervix is bulbous. Pap with GC/CHL and HPV 16/18 reflex genotyping performed.  Uterus is felt to be normal size, shape, and contour.  No adnexal masses or tenderness noted.Bladder is non tender, no masses felt. Extremities/musculoskeletal:  No swelling or varicosities noted, no clubbing or cyanosis Psych:  No mood changes, alert and cooperative,seems happy Fall risk is low PHQ 2 score 0. Examination chaperoned by Jarvis Morgan FNP student.  Impression: 1. Well woman exam with  routine gynecological exam       Plan: Pap with GC/CHLand HPV 16/18 reflex genotyping performed Physical in 1 year Pap in 3 if normal

## 2018-12-27 LAB — CYTOLOGY - PAP
Chlamydia: NEGATIVE
Diagnosis: NEGATIVE
HPV: NOT DETECTED
Neisseria Gonorrhea: NEGATIVE

## 2019-02-04 DIAGNOSIS — H52223 Regular astigmatism, bilateral: Secondary | ICD-10-CM | POA: Diagnosis not present

## 2019-02-04 DIAGNOSIS — H5213 Myopia, bilateral: Secondary | ICD-10-CM | POA: Diagnosis not present

## 2019-02-09 DIAGNOSIS — H5213 Myopia, bilateral: Secondary | ICD-10-CM | POA: Diagnosis not present

## 2019-02-24 DIAGNOSIS — H52223 Regular astigmatism, bilateral: Secondary | ICD-10-CM | POA: Diagnosis not present

## 2019-02-24 DIAGNOSIS — H5213 Myopia, bilateral: Secondary | ICD-10-CM | POA: Diagnosis not present

## 2019-05-20 NOTE — L&D Delivery Note (Signed)
Delivery Note A few hours after starting pitocin, pt was 8-9/BBOW.  AROM with clear fluid with baby immediately following. At 3:53 AM a viable female was delivered via Vaginal, Spontaneous (Presentation: Left Occiput Anterior).  APGAR: 9, 9; weight pending  After 1 minute, the cord was clamped and cut. 40 units of pitocin diluted in 1000cc LR was infused rapidly IV.  The placenta separated spontaneously and delivered via CCT and maternal pushing effort.  It was inspected and appears to be intact with a 3 VC.  IUD inserted (see note)  Anesthesia: local Episiotomy: None Lacerations: 2nd degree Suture Repair: 2.0 vicryl Est. Blood Loss (mL):    Mom to postpartum.  Baby to Couplet care / Skin to Skin.  Jacklyn Shell 01/20/2020, 4:25 AM    Post-Placental IUD Insertion Procedure Note  Patient identified, informed consent signed prior to delivery, signed copy in chart, time out was performed.    Vaginal, labial and perineal areas thoroughly inspected for lacerations. 2nd degree laceration identified - / hemostatic, not repaired prior to insertion of IUD.  Liletta IUD inserted with inserter per manufacturer's instructions. Despite the green tab showing in the lower part of the device (indicating that the IUD had been released) the IUD was not completely released and was pulled out of the vagina when inserter was removed.  THe procedure repeated with new IUD w/o problems.    Strings trimmed to the level of the introitus. Patient tolerated procedure well.   Patient given post procedure instructions and IUD care card with expiration date.  Patient is asked to keep IUD strings tucked in her vagina until her postpartum follow up visit in 4-6 Harr. Patient advised to abstain from sexual intercourse and pulling on strings before her follow-up visit. Patient verbalized an understanding of the plan of care and agrees.

## 2019-05-30 ENCOUNTER — Encounter: Payer: Self-pay | Admitting: *Deleted

## 2019-05-30 ENCOUNTER — Ambulatory Visit (INDEPENDENT_AMBULATORY_CARE_PROVIDER_SITE_OTHER): Payer: Medicaid Other | Admitting: *Deleted

## 2019-05-30 ENCOUNTER — Other Ambulatory Visit: Payer: Self-pay

## 2019-05-30 VITALS — BP 135/78 | HR 96 | Ht 65.0 in | Wt 142.5 lb

## 2019-05-30 DIAGNOSIS — Z3201 Encounter for pregnancy test, result positive: Secondary | ICD-10-CM

## 2019-05-30 LAB — POCT URINE PREGNANCY: Preg Test, Ur: POSITIVE — AB

## 2019-05-30 NOTE — Progress Notes (Signed)
Chart reviewed for nurse visit. Agree with plan of care.  [name], NP 05/30/2019 10:38 AM

## 2019-05-30 NOTE — Progress Notes (Signed)
   NURSE VISIT- PREGNANCY CONFIRMATION   SUBJECTIVE:  Theresa David is a 24 y.o. G56P1001 female at [redacted]w[redacted]d by certain LMP of Patient's last menstrual period was 04/22/2019. Here for pregnancy confirmation.  Home pregnancy test: positive x 1  She reports having headaches and dizziness.  She is not taking prenatal vitamins.    OBJECTIVE:  BP 135/78 (BP Location: Left Arm, Patient Position: Sitting, Cuff Size: Normal)   Pulse 96   Ht 5\' 5"  (1.651 m)   Wt 142 lb 8 oz (64.6 kg)   LMP 04/22/2019   Breastfeeding No   BMI 23.71 kg/m   Appears well, in no apparent distress OB History  Gravida Para Term Preterm AB Living  2 1 1  0 0 1  SAB TAB Ectopic Multiple Live Births  0 0 0 0 1    # Outcome Date GA Lbr Len/2nd Weight Sex Delivery Anes PTL Lv  2 Current           1 Term 09/23/17 [redacted]w[redacted]d 00:32 / 00:02 5 lb 12.1 oz (2.61 kg) M Vag-Spont Local  LIV    Results for orders placed or performed in visit on 05/30/19 (from the past 24 hour(s))  POCT urine pregnancy   Collection Time: 05/30/19  9:57 AM  Result Value Ref Range   Preg Test, Ur Positive (A) Negative    ASSESSMENT: Positive pregnancy test, [redacted]w[redacted]d by LMP    PLAN: Schedule for dating ultrasound in 3 [place] Prenatal vitamins: plans to begin OTC ASAP   Nausea medicines: not currently needed   OB packet given: Yes  07/28/19  05/30/2019 10:01 AM

## 2019-06-06 ENCOUNTER — Telehealth: Payer: Self-pay | Admitting: *Deleted

## 2019-06-06 MED ORDER — PRENATAL PLUS 27-1 MG PO TABS: 1.0000 | ORAL_TABLET | Freq: Every day | ORAL | 12 refills | Status: DC

## 2019-06-06 NOTE — Addendum Note (Signed)
Addended by: Cyril Mourning A on: 06/06/2019 12:15 PM   Modules accepted: Orders

## 2019-06-06 NOTE — Telephone Encounter (Signed)
Patient is requesting a prescription for prenatal vitamins.

## 2019-06-06 NOTE — Telephone Encounter (Signed)
Prenatal plus 1 daily,Rx'd

## 2019-06-17 ENCOUNTER — Other Ambulatory Visit: Payer: Self-pay | Admitting: Obstetrics & Gynecology

## 2019-06-17 DIAGNOSIS — O3680X0 Pregnancy with inconclusive fetal viability, not applicable or unspecified: Secondary | ICD-10-CM

## 2019-06-20 ENCOUNTER — Ambulatory Visit (INDEPENDENT_AMBULATORY_CARE_PROVIDER_SITE_OTHER): Payer: Medicaid Other

## 2019-06-20 ENCOUNTER — Other Ambulatory Visit: Payer: Self-pay

## 2019-06-20 DIAGNOSIS — Z3A08 8 weeks gestation of pregnancy: Secondary | ICD-10-CM | POA: Diagnosis not present

## 2019-06-20 DIAGNOSIS — O3680X0 Pregnancy with inconclusive fetal viability, not applicable or unspecified: Secondary | ICD-10-CM | POA: Diagnosis not present

## 2019-06-20 NOTE — Progress Notes (Signed)
[place] 8+3 wks,single IUP,crl 17.93 cm,normal ovaries,fhr 164 bpm

## 2019-07-14 ENCOUNTER — Other Ambulatory Visit: Payer: Self-pay | Admitting: Obstetrics and Gynecology

## 2019-07-14 DIAGNOSIS — Z3682 Encounter for antenatal screening for nuchal translucency: Secondary | ICD-10-CM

## 2019-07-18 ENCOUNTER — Ambulatory Visit: Payer: Medicaid Other | Admitting: *Deleted

## 2019-07-18 ENCOUNTER — Ambulatory Visit (INDEPENDENT_AMBULATORY_CARE_PROVIDER_SITE_OTHER): Payer: Medicaid Other

## 2019-07-18 ENCOUNTER — Ambulatory Visit (INDEPENDENT_AMBULATORY_CARE_PROVIDER_SITE_OTHER): Payer: Medicaid Other | Admitting: Women's Health

## 2019-07-18 ENCOUNTER — Other Ambulatory Visit: Payer: Self-pay

## 2019-07-18 ENCOUNTER — Encounter: Payer: Self-pay | Admitting: Women's Health

## 2019-07-18 VITALS — BP 114/68 | HR 81 | Wt 146.0 lb

## 2019-07-18 DIAGNOSIS — Z3682 Encounter for antenatal screening for nuchal translucency: Secondary | ICD-10-CM | POA: Diagnosis not present

## 2019-07-18 DIAGNOSIS — Z3481 Encounter for supervision of other normal pregnancy, first trimester: Secondary | ICD-10-CM

## 2019-07-18 DIAGNOSIS — Z363 Encounter for antenatal screening for malformations: Secondary | ICD-10-CM

## 2019-07-18 DIAGNOSIS — Z3A12 12 weeks gestation of pregnancy: Secondary | ICD-10-CM

## 2019-07-18 DIAGNOSIS — Z23 Encounter for immunization: Secondary | ICD-10-CM | POA: Diagnosis not present

## 2019-07-18 DIAGNOSIS — Z3480 Encounter for supervision of other normal pregnancy, unspecified trimester: Secondary | ICD-10-CM

## 2019-07-18 LAB — POCT URINALYSIS DIPSTICK OB
Blood, UA: NEGATIVE
Glucose, UA: NEGATIVE
Ketones, UA: NEGATIVE
Leukocytes, UA: NEGATIVE
Nitrite, UA: NEGATIVE
POC,PROTEIN,UA: NEGATIVE

## 2019-07-18 MED ORDER — BLOOD PRESSURE MONITOR MISC: 0 refills | Status: DC

## 2019-07-18 NOTE — Patient Instructions (Signed)
Theresa David, I greatly value your feedback.  If you receive a survey following your visit with [place] today, we appreciate you taking the time to fill it out.  Thanks, Theresa David, CNM, [facility]  [facility] HAS MOVED!!! It is now [facility] at [facility] ([address] [place], [place] [address]) Entrance located off of [address] Free 24/7 valet parking   Nausea & Vomiting  Have saltine crackers or pretzels by your bed and eat a few bites before you raise your head out of bed in the morning  Eat small frequent meals throughout the day instead of large meals  Drink plenty of fluids throughout the day to stay hydrated, just don't drink a lot of fluids with your meals.  This can make your stomach fill up faster making you feel sick  Do not brush your teeth right after you eat  Products with real ginger are good for nausea, like ginger ale and ginger hard candy Make sure it says made with real ginger!  Sucking on sour candy like lemon heads is also good for nausea  If your prenatal vitamins make you nauseated, take them at night so you will sleep through the nausea  Sea Bands  If you feel like you need medicine for the nausea & vomiting please let us know  If you are unable to keep any fluids or food down please let us know   Constipation  Drink plenty of fluid, preferably water, throughout the day  Eat foods high in fiber such as fruits, vegetables, and grains  Exercise, such as walking, is a good way to keep your bowels regular  Drink warm fluids, especially warm prune juice, or decaf coffee  Eat a 1/2 cup of real oatmeal (not instant), 1/2 cup applesauce, and 1/2-1 cup warm prune juice every day  If needed, you may take Colace (docusate sodium) stool softener once or twice a day to help keep the stool soft.   If you still are having problems with constipation, you may take Miralax once daily as needed to help keep your bowels regular.   Home Blood  Pressure Monitoring for Patients   Your provider has recommended that you check your blood pressure (BP) at least once a week at home. If you do not have a blood pressure cuff at home, one will be provided for you. Contact your provider if you have not received your monitor within 1 week.   Helpful Tips for Accurate Home Blood Pressure Checks  . Don't smoke, exercise, or drink caffeine 30 minutes before checking your BP . Use the restroom before checking your BP (a full bladder can raise your pressure) . Relax in a comfortable upright chair . Feet on the ground . Left arm resting comfortably on a flat surface at the level of your heart . Legs uncrossed . Back supported . Sit quietly and don't talk . Place the cuff on your bare arm . Adjust snuggly, so that only two fingertips can fit between your skin and the top of the cuff . Check 2 readings separated by at least one minute . Keep a log of your BP readings . For a visual, please reference this diagram: http://ccnc.care/bpdiagram  Provider Name: [facility]     Phone: [phone]  Zone 1: ALL CLEAR  Continue to monitor your symptoms:  . BP reading is less than 140 (top number) or less than 90 (bottom number)  . No right upper stomach pain .  No headaches or seeing spots . No feeling nauseated or throwing up . No swelling in face and hands  Zone 2: CAUTION Call your doctor's office for any of the following:  . BP reading is greater than 140 (top number) or greater than 90 (bottom number)  . Stomach pain under your ribs in the middle or right side . Headaches or seeing spots . Feeling nauseated or throwing up . Swelling in face and hands  Zone 3: EMERGENCY  Seek immediate medical care if you have any of the following:  . BP reading is greater than160 (top number) or greater than 110 (bottom number) . Severe headaches not improving with Tylenol . Serious difficulty catching your breath . Any worsening symptoms from Zone  2    First Trimester of Pregnancy The first trimester of pregnancy is from week 1 until the end of week 12 (months 1 through 3). A week after a sperm fertilizes an egg, the egg will implant on the wall of the uterus. This embryo will begin to develop into a baby. Genes from you and your partner are forming the baby. The female genes determine whether the baby is a boy or a girl. At 6-8 Brau, the eyes and face are formed, and the heartbeat can be seen on ultrasound. At the end of 12 [place], all the baby's organs are formed.  Now that you are pregnant, you will want to do everything you can to have a healthy baby. Two of the most important things are to get good prenatal care and to follow your health care provider's instructions. Prenatal care is all the medical care you receive before the baby's birth. This care will help prevent, find, and treat any problems during the pregnancy and childbirth. BODY CHANGES Your body goes through many changes during pregnancy. The changes vary from woman to woman.   You may gain or lose a couple of pounds at first.  You may feel sick to your stomach (nauseous) and throw up (vomit). If the vomiting is uncontrollable, call your health care provider.  You may tire easily.  You may develop headaches that can be relieved by medicines approved by your health care provider.  You may urinate more often. Painful urination may mean you have a bladder infection.  You may develop heartburn as a result of your pregnancy.  You may develop constipation because certain hormones are causing the muscles that push waste through your intestines to slow down.  You may develop hemorrhoids or swollen, bulging veins (varicose veins).  Your breasts may begin to grow larger and become tender. Your nipples may stick out more, and the tissue that surrounds them (areola) may become darker.  Your gums may bleed and may be sensitive to brushing and flossing.  Dark spots or blotches  (chloasma, mask of pregnancy) may develop on your face. This will likely fade after the baby is born.  Your menstrual periods will stop.  You may have a loss of appetite.  You may develop cravings for certain kinds of food.  You may have changes in your emotions from day to day, such as being excited to be pregnant or being concerned that something may go wrong with the pregnancy and baby.  You may have more vivid and strange dreams.  You may have changes in your hair. These can include thickening of your hair, rapid growth, and changes in texture. Some women also have hair loss during or after pregnancy, or hair that feels dry  or thin. Your hair will most likely return to normal after your baby is born. WHAT TO EXPECT AT YOUR PRENATAL VISITS During a routine prenatal visit:  You will be weighed to make sure you and the baby are growing normally.  Your blood pressure will be taken.  Your abdomen will be measured to track your baby's growth.  The fetal heartbeat will be listened to starting around week 10 or 12 of your pregnancy.  Test results from any previous visits will be discussed. Your health care provider may ask you:  How you are feeling.  If you are feeling the baby move.  If you have had any abnormal symptoms, such as leaking fluid, bleeding, severe headaches, or abdominal cramping.  If you have any questions. Other tests that may be performed during your first trimester include:  Blood tests to find your blood type and to check for the presence of any previous infections. They will also be used to check for low iron levels (anemia) and Rh antibodies. Later in the pregnancy, blood tests for diabetes will be done along with other tests if problems develop.  Urine tests to check for infections, diabetes, or protein in the urine.  An ultrasound to confirm the proper growth and development of the baby.  An amniocentesis to check for possible genetic problems.  Fetal  screens for spina bifida and Down syndrome.  You may need other tests to make sure you and the baby are doing well. HOME CARE INSTRUCTIONS  Medicines  Follow your health care provider's instructions regarding medicine use. Specific medicines may be either safe or unsafe to take during pregnancy.  Take your prenatal vitamins as directed.  If you develop constipation, try taking a stool softener if your health care provider approves. Diet  Eat regular, well-balanced meals. Choose a variety of foods, such as meat or vegetable-based protein, fish, milk and low-fat dairy products, vegetables, fruits, and whole grain breads and cereals. Your health care provider will help you determine the amount of weight gain that is right for you.  Avoid raw meat and uncooked cheese. These carry germs that can cause birth defects in the baby.  Eating four or five small meals rather than three large meals a day may help relieve nausea and vomiting. If you start to feel nauseous, eating a few soda crackers can be helpful. Drinking liquids between meals instead of during meals also seems to help nausea and vomiting.  If you develop constipation, eat more high-fiber foods, such as fresh vegetables or fruit and whole grains. Drink enough fluids to keep your urine clear or pale yellow. Activity and Exercise  Exercise only as directed by your health care provider. Exercising will help you:  Control your weight.  Stay in shape.  Be prepared for labor and delivery.  Experiencing pain or cramping in the lower abdomen or low back is a good sign that you should stop exercising. Check with your health care provider before continuing normal exercises.  Try to avoid standing for long periods of time. Move your legs often if you must stand in one place for a long time.  Avoid heavy lifting.  Wear low-heeled shoes, and practice good posture.  You may continue to have sex unless your health care provider directs you  otherwise. Relief of Pain or Discomfort  Wear a good support bra for breast tenderness.    Take warm sitz baths to soothe any pain or discomfort caused by hemorrhoids. Use hemorrhoid cream if your  health care provider approves.    Rest with your legs elevated if you have leg cramps or low back pain.  If you develop varicose veins in your legs, wear support hose. Elevate your feet for 15 minutes, 3-4 times a day. Limit salt in your diet. Prenatal Care  Schedule your prenatal visits by the twelfth week of pregnancy. They are usually scheduled monthly at first, then more often in the last 2 months before delivery.  Write down your questions. Take them to your prenatal visits.  Keep all your prenatal visits as directed by your health care provider. Safety  Wear your seat belt at all times when driving.  Make a list of emergency phone numbers, including numbers for family, friends, the hospital, and police and fire departments. General Tips  Ask your health care provider for a referral to a local prenatal education class. Begin classes no later than at the beginning of month 6 of your pregnancy.  Ask for help if you have counseling or nutritional needs during pregnancy. Your health care provider can offer advice or refer you to specialists for help with various needs.  Do not use hot tubs, steam rooms, or saunas.  Do not douche or use tampons or scented sanitary pads.  Do not cross your legs for long periods of time.  Avoid cat litter boxes and soil used by cats. These carry germs that can cause birth defects in the baby and possibly loss of the fetus by miscarriage or stillbirth.  Avoid all smoking, herbs, alcohol, and medicines not prescribed by your health care provider. Chemicals in these affect the formation and growth of the baby.  Schedule a dentist appointment. At home, brush your teeth with a soft toothbrush and be gentle when you floss. SEEK MEDICAL CARE IF:   You have  dizziness.  You have mild pelvic cramps, pelvic pressure, or nagging pain in the abdominal area.  You have persistent nausea, vomiting, or diarrhea.  You have a bad smelling vaginal discharge.  You have pain with urination.  You notice increased swelling in your face, hands, legs, or ankles. SEEK IMMEDIATE MEDICAL CARE IF:   You have a fever.  You are leaking fluid from your vagina.  You have spotting or bleeding from your vagina.  You have severe abdominal cramping or pain.  You have rapid weight gain or loss.  You vomit blood or material that looks like coffee grounds.  You are exposed to [place] measles and have never had them.  You are exposed to fifth disease or chickenpox.  You develop a severe headache.  You have shortness of breath.  You have any kind of trauma, such as from a fall or a car accident. Document Released: 04/29/2001 Document Revised: 09/19/2013 Document Reviewed: 03/15/2013 [facility] Patient Information 2015 [place], [place]. This information is not intended to replace advice given to you by your health care provider. Make sure you discuss any questions you have with your health care provider.  Coronavirus (COVID-19) Are you at risk?  Are you at risk for the Coronavirus (COVID-19)?  To be considered HIGH RISK for Coronavirus (COVID-19), you have to meet the following criteria:  . Traveled to [place], [place], [place], [place] or [place]; or in the [place] to [place], [place], [place], or [place]; and have fever, cough, and shortness of breath within the last 2 [place] of travel OR . Been in close contact with a person diagnosed with COVID-19 within the last 2 [place] and  have fever, cough, and shortness of breath . IF YOU DO NOT MEET THESE CRITERIA, YOU ARE CONSIDERED LOW RISK FOR COVID-19.  What to do if you are HIGH RISK for COVID-19?  Marland Kitchen If you are having a medical emergency, call 911. . Seek medical care right away. Before you go to a  doctor's office, urgent care or emergency department, call ahead and tell them about your recent travel, contact with someone diagnosed with COVID-19, and your symptoms. You should receive instructions from your physician's office regarding next steps of care.  . When you arrive at healthcare provider, tell the healthcare staff immediately you have returned from visiting [place], [place], [place], [place] or [place]; or traveled in the [place] to [place], [place], [place], or [place]; in the last two [place] or you have been in close contact with a person diagnosed with COVID-19 in the last 2 [place].   . Tell the health care staff about your symptoms: fever, cough and shortness of breath. . After you have been seen by a medical provider, you will be either: o Tested for (COVID-19) and discharged home on quarantine except to seek medical care if symptoms worsen, and asked to  - Stay home and avoid contact with others until you get your results (4-5 days)  - Avoid travel on public transportation if possible (such as bus, train, or airplane) or o Sent to the Emergency Department by EMS for evaluation, COVID-19 testing, and possible admission depending on your condition and test results.  What to do if you are LOW RISK for COVID-19?  Reduce your risk of any infection by using the same precautions used for avoiding the common cold or flu:  Marland Kitchen Wash your hands often with soap and warm water for at least 20 seconds.  If soap and water are not readily available, use an alcohol-based hand sanitizer with at least 60% alcohol.  . If coughing or sneezing, cover your mouth and nose by coughing or sneezing into the elbow areas of your shirt or coat, into a tissue or into your sleeve (not your hands). . Avoid shaking hands with others and consider head nods or verbal greetings only. . Avoid touching your eyes, nose, or mouth with unwashed hands.  . Avoid close contact with people who are sick. . Avoid  places or events with large numbers of people in one location, like concerts or sporting events. . Carefully consider travel plans you have or are making. . If you are planning any travel outside or inside the [place], visit the CDC's Travelers' Health webpage for the latest health notices. . If you have some symptoms but not all symptoms, continue to monitor at home and seek medical attention if your symptoms worsen. . If you are having a medical emergency, call 911.   [place] / e-Visit: [email or web address]         [facility]: [facility]: [id number]                   [facility]: [phone]

## 2019-07-18 NOTE — Progress Notes (Signed)
INITIAL OBSTETRICAL VISIT Patient name: Ronesha Heenan MRN [id number]  Date of birth: 02-01-1996 Chief Complaint:   Initial Prenatal Visit  History of Present Illness:   Theresa David is a 24 y.o. G39P1001 African American female at [redacted]w[redacted]d by LMP c/w 8wk u/s, with an Estimated Date of Delivery: 01/27/20 being seen today for her initial obstetrical visit.   Her obstetrical history is significant for IOL d/t cholestasis of pregnancy, uncomplicated SVB.   Today she reports no complaints.  Patient's last menstrual period was 04/22/2019. Last pap 12/22/18. Results were: normal Review of Systems:   Pertinent items are noted in HPI Denies cramping/contractions, leakage of fluid, vaginal bleeding, abnormal vaginal discharge w/ itching/odor/irritation, headaches, visual changes, shortness of breath, chest pain, abdominal pain, severe nausea/vomiting, or problems with urination or bowel movements unless otherwise stated above.  Pertinent History Reviewed:  Reviewed past medical,surgical, social, obstetrical and family history.  Reviewed problem list, medications and allergies. OB History  Gravida Para Term Preterm AB Living  2 1 1  0 0 1  SAB TAB Ectopic Multiple Live Births  0 0 0 0 1    # Outcome Date GA Lbr Len/2nd Weight Sex Delivery Anes PTL Lv  2 Current           1 Term 09/23/17 [redacted]w[redacted]d 00:32 / 00:02 5 lb 12.1 oz (2.61 kg) M Vag-Spont Local N LIV     Complications: Cholestasis   Physical Assessment:   Vitals:   07/18/19 1053  BP: 114/68  Pulse: 81  Weight: 146 lb (66.2 kg)  Body mass index is 24.3 kg/m.       Physical Examination:  General appearance - well appearing, and in no distress  Mental status - alert, oriented to person, place, and time  Psych:  She has a normal mood and affect  Skin - warm and dry, normal color, no suspicious lesions noted  Chest - effort normal, all lung fields clear to auscultation bilaterally  Heart - normal rate and regular rhythm  Abdomen - soft,  nontender  Extremities:  No swelling or varicosities noted  Thin prep pap is not done   TODAY'S NT [place] 12+3 wks,measurements c/w dates,crl 62.28 mm,fhr 154 bpm,anterior placenta,normal ovaries,NB present,NT 1.4 mm  Results for orders placed or performed in visit on 07/18/19 (from the past 24 hour(s))  POC Urinalysis Dipstick OB   Collection Time: 07/18/19 11:10 AM  Result Value Ref Range   Color, UA     Clarity, UA     Glucose, UA Negative Negative   Bilirubin, UA     Ketones, UA neg    Spec Grav, UA     Blood, UA neg    pH, UA     POC,PROTEIN,UA Negative Negative, Trace, Small (1+), Moderate (2+), Large (3+), 4+   Urobilinogen, UA     Nitrite, UA neg    Leukocytes, UA Negative Negative   Appearance     Odor      Assessment & Plan:  1) Low-Risk Pregnancy G2P1001 at [redacted]w[redacted]d with an Estimated Date of Delivery: 01/27/20   2) Initial OB visit  3) H/O ICP  Meds:  Meds ordered this encounter  Medications  . Blood Pressure Monitor MISC    Sig: For regular home bp monitoring during pregnancy    Dispense:  1 each    Refill:  0    Z34.80    Initial labs obtained Continue prenatal vitamins Reviewed n/v relief measures and warning s/s to report Reviewed recommended weight  gain based on pre-gravid BMI Encouraged well-balanced diet Genetic Screening discussed: requested nt/it, maternit21 Cystic fibrosis, SMA, Fragile X screening discussed requested Ultrasound discussed; fetal survey: requested CCNC completed>PCM not here, form faxed The nature of [facility] [employer] with multiple MDs and other Advanced Practice Providers was explained to patient; also emphasized that fellows, residents, and students are part of our team. Does not have home bp cuff. Rx faxed to [facility]. Check bp weekly, let us know if >140/90. Flu shot today   Follow-up: Return in about 6 [place] (around 08/26/2019) for LROB, 2nd IT, GZ:FPOIPPG, in person, CNM.   Orders Placed This Encounter    Procedures  . Urine Culture  . GC/Chlamydia Probe Amp  . US OB Comp + 14 Wk  . Flu Vaccine QUAD 36+ mos IM  . Integrated 1  . MaterniT 21 plus Core, Blood  . Fragile X, PCR and Southern  . SMN1 COPY NUMBER ANALYSIS (SMA Carrier Screen)  . Hepatitis C antibody  . Obstetric Panel, Including HIV  . Pain Management Screening Profile (10S)  . POC Urinalysis Dipstick OB    Cheral Marker CNM, [facility] 07/18/2019 11:32 AM

## 2019-07-18 NOTE — Progress Notes (Signed)
[place] 12+3 wks,measurements c/w dates,crl 62.28 mm,fhr 154 bpm,anterior placenta,normal ovaries,NB present,NT 1.4 mm

## 2019-07-19 LAB — PMP SCREEN PROFILE (10S), URINE
Amphetamine Scrn, Ur: NEGATIVE ng/mL
BARBITURATE SCREEN URINE: NEGATIVE ng/mL
BENZODIAZEPINE SCREEN, URINE: NEGATIVE ng/mL
CANNABINOIDS UR QL SCN: NEGATIVE ng/mL
Cocaine (Metab) Scrn, Ur: NEGATIVE ng/mL
Creatinine(Crt), U: 81.7 mg/dL (ref 20.0–300.0)
Methadone Screen, Urine: NEGATIVE ng/mL
OXYCODONE+OXYMORPHONE UR QL SCN: NEGATIVE ng/mL
Opiate Scrn, Ur: NEGATIVE ng/mL
Ph of Urine: 8.3 (ref 4.5–8.9)
Phencyclidine Qn, Ur: NEGATIVE ng/mL
Propoxyphene Scrn, Ur: NEGATIVE ng/mL

## 2019-07-19 LAB — GC/CHLAMYDIA PROBE AMP
Chlamydia trachomatis, NAA: NEGATIVE
Neisseria Gonorrhoeae by PCR: NEGATIVE

## 2019-07-20 LAB — URINE CULTURE

## 2019-07-30 LAB — MATERNIT 21 PLUS CORE, BLOOD
Fetal Fraction: 10
Result (T21): NEGATIVE
Trisomy 13 (Patau syndrome): NEGATIVE
Trisomy 18 (Edwards syndrome): NEGATIVE
Trisomy 21 (Down syndrome): NEGATIVE

## 2019-07-30 LAB — OBSTETRIC PANEL, INCLUDING HIV
Antibody Screen: NEGATIVE
Basophils Absolute: 0 10*3/uL (ref 0.0–0.2)
Basos: 0 %
EOS (ABSOLUTE): 0.2 10*3/uL (ref 0.0–0.4)
Eos: 4 %
HIV Screen 4th Generation wRfx: NONREACTIVE
Hematocrit: 34.5 % (ref 34.0–46.6)
Hemoglobin: 11.8 g/dL (ref 11.1–15.9)
Hepatitis B Surface Ag: NEGATIVE
Immature Grans (Abs): 0 10*3/uL (ref 0.0–0.1)
Immature Granulocytes: 0 %
Lymphocytes Absolute: 1.6 10*3/uL (ref 0.7–3.1)
Lymphs: 30 %
MCH: 30.4 pg (ref 26.6–33.0)
MCHC: 34.2 g/dL (ref 31.5–35.7)
MCV: 89 fL (ref 79–97)
Monocytes Absolute: 0.3 10*3/uL (ref 0.1–0.9)
Monocytes: 5 %
Neutrophils Absolute: 3.1 10*3/uL (ref 1.4–7.0)
Neutrophils: 61 %
Platelets: 266 10*3/uL (ref 150–450)
RBC: 3.88 x10E6/uL (ref 3.77–5.28)
RDW: 12.9 % (ref 11.7–15.4)
RPR Ser Ql: NONREACTIVE
Rh Factor: POSITIVE
Rubella Antibodies, IGG: 4.62 index (ref >0.99)
WBC: 5.2 10*3/uL (ref 3.4–10.8)

## 2019-07-30 LAB — SMN1 COPY NUMBER ANALYSIS (SMA CARRIER SCREENING)

## 2019-07-30 LAB — INTEGRATED 1
Crown Rump Length: 62.3 mm
Gest. Age on Collection Date: 12.4 weeks
Maternal Age at EDD: 22.9 yr
Nuchal Translucency (NT): 1.4 mm
Number of Fetuses: 1
PAPP-A Value: 835.2 ng/mL
Weight: 146 [lb_av]

## 2019-07-30 LAB — HEPATITIS C ANTIBODY: Hep C Virus Ab: 0.1 s/co ratio (ref 0.0–0.9)

## 2019-07-30 LAB — FRAGILE X, PCR AND SOUTHERN

## 2019-08-29 ENCOUNTER — Other Ambulatory Visit: Payer: Self-pay

## 2019-08-29 ENCOUNTER — Encounter: Payer: Self-pay | Admitting: Obstetrics & Gynecology

## 2019-08-29 ENCOUNTER — Ambulatory Visit (INDEPENDENT_AMBULATORY_CARE_PROVIDER_SITE_OTHER): Payer: Medicaid Other | Admitting: Obstetrics & Gynecology

## 2019-08-29 ENCOUNTER — Ambulatory Visit (INDEPENDENT_AMBULATORY_CARE_PROVIDER_SITE_OTHER): Payer: Medicaid Other

## 2019-08-29 VITALS — BP 115/77 | HR 75 | Wt 151.0 lb

## 2019-08-29 DIAGNOSIS — Z3A18 18 weeks gestation of pregnancy: Secondary | ICD-10-CM

## 2019-08-29 DIAGNOSIS — Z1379 Encounter for other screening for genetic and chromosomal anomalies: Secondary | ICD-10-CM

## 2019-08-29 DIAGNOSIS — Z3480 Encounter for supervision of other normal pregnancy, unspecified trimester: Secondary | ICD-10-CM

## 2019-08-29 DIAGNOSIS — Z8759 Personal history of other complications of pregnancy, childbirth and the puerperium: Secondary | ICD-10-CM | POA: Diagnosis not present

## 2019-08-29 DIAGNOSIS — Z363 Encounter for antenatal screening for malformations: Secondary | ICD-10-CM

## 2019-08-29 DIAGNOSIS — Z3482 Encounter for supervision of other normal pregnancy, second trimester: Secondary | ICD-10-CM

## 2019-08-29 NOTE — Progress Notes (Signed)
   LOW-RISK PREGNANCY VISIT Patient name: Theresa David MRN [id number]  Date of birth: 08-11-1995 Chief Complaint:   Routine Prenatal Visit  History of Present Illness:   Theresa David is a 24 y.o. G70P1001 female at [redacted]w[redacted]d with an Estimated Date of Delivery: 01/27/20 being seen today for ongoing management of a low-risk pregnancy.  Depression screen [facility] 2/9 07/18/2019 12/22/2018 03/11/2017 01/18/2013  Decreased Interest 0 0 0 0  Down, Depressed, Hopeless 0 0 0 0  PHQ - 2 Score 0 0 0 0  Altered sleeping 0 - 0 2  Tired, decreased energy 0 - 1 1  Change in appetite 0 - - 0  Feeling bad or failure about yourself  0 - 0 1  Trouble concentrating 0 - 0 1  Moving slowly or fidgety/restless 0 - 0 2  Suicidal thoughts 0 - 0 0  PHQ-9 Score 0 - 1 7  Difficult doing work/chores - - Not difficult at all -    Today she reports no complaints. Contractions: Not present. Vag. Bleeding: None.  Movement: Absent. denies leaking of fluid. Review of Systems:   Pertinent items are noted in HPI Denies abnormal vaginal discharge w/ itching/odor/irritation, headaches, visual changes, shortness of breath, chest pain, abdominal pain, severe nausea/vomiting, or problems with urination or bowel movements unless otherwise stated above. Pertinent History Reviewed:  Reviewed past medical,surgical, social, obstetrical and family history.  Reviewed problem list, medications and allergies. Physical Assessment:   Vitals:   08/29/19 1050  BP: 115/77  Pulse: 75  Weight: 151 lb (68.5 kg)  Body mass index is 25.13 kg/m.        Physical Examination:   General appearance: Well appearing, and in no distress  Mental status: Alert, oriented to person, place, and time  Skin: Warm & dry  Cardiovascular: Normal heart rate noted  Respiratory: Normal respiratory effort, no distress  Abdomen: Soft, gravid, nontender  Pelvic: Cervical exam deferred         Extremities: Edema: None  Fetal Status: Fetal Heart Rate (bpm): 150    Movement: Absent    Chaperone: n/a    No results found for this or any previous visit (from the past 24 hour(s)).  Assessment & Plan:  1) Low-risk pregnancy G2P1001 at [redacted]w[redacted]d with an Estimated Date of Delivery: 01/27/20   2) sonogram is normal,    Meds: No orders of the defined types were placed in this encounter.  Labs/procedures today: sonogram is done normal report is done  Plan:  Continue routine obstetrical care  Next visit: prefers online    Reviewed:  labor symptoms and general obstetric precautions including but not limited to vaginal bleeding, contractions, leaking of fluid and fetal movement were reviewed in detail with the patient.  All questions were answered.  home bp cuff. Rx faxed to . Check bp weekly, let us know if >140/90.   Follow-up: Return for [employer] visit, LROB.  Orders Placed This Encounter  Procedures  . INTEGRATED 2   Lazaro Arms  08/29/2019 11:13 AM

## 2019-08-29 NOTE — Progress Notes (Signed)
[place] 18+3 wks,cephalic,anterior placenta gr 0,normal ovaries,svp of fluid 4.3 cm,fhr 146 bpm,cx 4.4 cm,anatomy complete,no obvious abnormalities

## 2019-08-31 LAB — INTEGRATED 2
AFP MoM: 0.77
Alpha-Fetoprotein: 38.6 ng/mL
Crown Rump Length: 62.3 mm
DIA MoM: 0.87
DIA Value: 141.1 pg/mL
Estriol, Unconjugated: 1.49 ng/mL
Gest. Age on Collection Date: 12.4 weeks
Gestational Age: 18.4 weeks
Maternal Age at EDD: 22.9 yr
Nuchal Translucency (NT): 1.4 mm
Nuchal Translucency MoM: 1.01
Number of Fetuses: 1
PAPP-A MoM: 0.9
PAPP-A Value: 835.2 ng/mL
Test Results:: NEGATIVE
Weight: 146 [lb_av]
Weight: 146 [lb_av]
hCG MoM: 0.7
hCG Value: 17 IU/mL
uE3 MoM: 0.95

## 2019-09-26 ENCOUNTER — Telehealth: Payer: Medicaid Other | Admitting: Women's Health

## 2019-09-26 ENCOUNTER — Other Ambulatory Visit: Payer: Self-pay

## 2019-09-26 ENCOUNTER — Encounter: Payer: Self-pay | Admitting: Women's Health

## 2019-09-26 ENCOUNTER — Ambulatory Visit (INDEPENDENT_AMBULATORY_CARE_PROVIDER_SITE_OTHER): Payer: Medicaid Other | Admitting: Women's Health

## 2019-09-26 VITALS — BP 124/63 | HR 85 | Wt 161.0 lb

## 2019-09-26 DIAGNOSIS — Z1389 Encounter for screening for other disorder: Secondary | ICD-10-CM

## 2019-09-26 DIAGNOSIS — Z331 Pregnant state, incidental: Secondary | ICD-10-CM

## 2019-09-26 DIAGNOSIS — Z3480 Encounter for supervision of other normal pregnancy, unspecified trimester: Secondary | ICD-10-CM

## 2019-09-26 DIAGNOSIS — Z3482 Encounter for supervision of other normal pregnancy, second trimester: Secondary | ICD-10-CM

## 2019-09-26 DIAGNOSIS — Z3A22 22 weeks gestation of pregnancy: Secondary | ICD-10-CM

## 2019-09-26 LAB — POCT URINALYSIS DIPSTICK OB
Blood, UA: NEGATIVE
Glucose, UA: NEGATIVE
Ketones, UA: NEGATIVE
Leukocytes, UA: NEGATIVE
Nitrite, UA: NEGATIVE
POC,PROTEIN,UA: NEGATIVE

## 2019-09-26 NOTE — Progress Notes (Signed)
LOW-RISK PREGNANCY VISIT Patient name: Theresa David MRN [id number]  Date of birth: [birth year] Chief Complaint:   Routine Prenatal Visit  History of Present Illness:   Theresa David is a 24 y.o. G30P1001 female at [redacted]w[redacted]d with an Estimated Date of Delivery: 01/27/20 being seen today for ongoing management of a low-risk pregnancy.  Depression screen [facility] 2/9 07/18/2019 12/22/2018 03/11/2017 01/18/2013  Decreased Interest 0 0 0 0  Down, Depressed, Hopeless 0 0 0 0  PHQ - 2 Score 0 0 0 0  Altered sleeping 0 - 0 2  Tired, decreased energy 0 - 1 1  Change in appetite 0 - - 0  Feeling bad or failure about yourself  0 - 0 1  Trouble concentrating 0 - 0 1  Moving slowly or fidgety/restless 0 - 0 2  Suicidal thoughts 0 - 0 0  PHQ-9 Score 0 - 1 7  Difficult doing work/chores - - Not difficult at all -    Today she reports no complaints. Contractions: Not present. Vag. Bleeding: None.  Movement: Present. denies leaking of fluid. Review of Systems:   Pertinent items are noted in HPI Denies abnormal vaginal discharge w/ itching/odor/irritation, headaches, visual changes, shortness of breath, chest pain, abdominal pain, severe nausea/vomiting, or problems with urination or bowel movements unless otherwise stated above. Pertinent History Reviewed:  Reviewed past medical,surgical, social, obstetrical and family history.  Reviewed problem list, medications and allergies. Physical Assessment:   Vitals:   09/26/19 1001  BP: 124/63  Pulse: 85  Weight: 161 lb (73 kg)  Body mass index is 26.79 kg/m.        Physical Examination:   General appearance: Well appearing, and in no distress  Mental status: Alert, oriented to person, place, and time  Skin: Warm & dry  Cardiovascular: Normal heart rate noted  Respiratory: Normal respiratory effort, no distress  Abdomen: Soft, gravid, nontender  Pelvic: Cervical exam deferred         Extremities: Edema: None  Fetal Status: Fetal Heart Rate (bpm): 150  Fundal Height: 20 cm Movement: Present    Chaperone: n/a    Results for orders placed or performed in visit on 09/26/19 (from the past 24 hour(s))  POC Urinalysis Dipstick OB   Collection Time: 09/26/19 10:08 AM  Result Value Ref Range   Color, UA     Clarity, UA     Glucose, UA Negative Negative   Bilirubin, UA     Ketones, UA neg    Spec Grav, UA     Blood, UA neg    pH, UA     POC,PROTEIN,UA Negative Negative, Trace, Small (1+), Moderate (2+), Large (3+), 4+   Urobilinogen, UA     Nitrite, UA neg    Leukocytes, UA Negative Negative   Appearance     Odor      Assessment & Plan:  1) Low-risk pregnancy G2P1001 at [redacted]w[redacted]d with an Estimated Date of Delivery: 01/27/20    Meds: No orders of the defined types were placed in this encounter.  Labs/procedures today: none  Plan:  Continue routine obstetrical care  Next visit: prefers will be in person for pn2    Reviewed: Preterm labor symptoms and general obstetric precautions including but not limited to vaginal bleeding, contractions, leaking of fluid and fetal movement were reviewed in detail with the patient.  All questions were answered. Has home bp cuff. Check bp weekly, let us know if >140/90.   Follow-up: Return in about 4 [place] (around  10/24/2019) for LROB, PN2, in person, CNM.  Orders Placed This Encounter  Procedures  . POC Urinalysis Dipstick OB   Cheral Marker CNM, [facility] 09/26/2019 10:28 AM

## 2019-09-26 NOTE — Patient Instructions (Signed)
Theresa David, I greatly value your feedback.  If you receive a survey following your visit with [place] today, we appreciate you taking the time to fill it out.  Thanks, [name], CNM, WHNP-BC   You will have your sugar test next visit.  Please do not eat or drink anything after midnight the night before you come, not even water.  You will be here for at least two hours.  Please make an appointment online for the bloodwork at [email or web address] for 8:30am (or as close to this as possible). Make sure you select the [facility] service center. The day of the appointment, check in with our office first, then you will go to [facility] to start the sugar test.    [facility] [place], [place] [address]) Entrance C, located off of [address] valet parking  Go to [email or web address] to register for FREE online childbirth classes   Call the office [phone]) or go to [facility] if:  You begin to have strong, frequent contractions  Your water breaks.  Sometimes it is a big gush of fluid, sometimes it is just a trickle that keeps getting your panties wet or running down your legs  You have vaginal bleeding.  It is normal to have a small amount of spotting if your cervix was checked.   You don't feel your baby moving like normal.  If you don't, get you something to eat and drink and lay down and focus on feeling your baby move.   If your baby is still not moving like normal, you should call the office or go to [facility].  [place] Pediatricians/Family Doctors:  [facility] [phone]            [facility] [phone]                 [facility] [phone] (usually not accepting new patients unless you have family there already, you are always welcome to call and ask)       [facility] Department [phone]       [facility] Pediatricians/Family Doctors:   [facility]:  [phone]  [facility]: [phone]  [facility]: [phone]  [facility] Doctors:   [facility]: [phone]   [facility]: [phone]  [facility] Doctors:  [facility]: [phone]   Home Blood Pressure Monitoring for Patients   Your provider has recommended that you check your blood pressure (BP) at least once a week at home. If you do not have a blood pressure cuff at home, one will be provided for you. Contact your provider if you have not received your monitor within 1 week.   Helpful Tips for Accurate Home Blood Pressure Checks  . Don't smoke, exercise, or drink caffeine 30 minutes before checking your BP . Use the restroom before checking your BP (a full bladder can raise your pressure) . Relax in a comfortable upright chair . Feet on the ground . Left arm resting comfortably on a flat surface at the level of your heart . Legs uncrossed . Back supported . Sit quietly and don't talk . Place the cuff on your bare arm . Adjust snuggly, so that only two fingertips can fit between your skin and the top of the cuff . Check 2 readings separated by at least one minute . Keep a log of your BP readings . For a visual, please reference  this diagram: http://ccnc.care/bpdiagram  Provider Name: [facility]     Phone: [phone]  Zone 1: ALL CLEAR  Continue to monitor your symptoms:  . BP reading is less than 140 (top number) or less than 90 (bottom number)  . No right upper stomach pain . No headaches or seeing spots . No feeling nauseated or throwing up . No swelling in face and hands  Zone 2: CAUTION Call your doctor's office for any of the following:  . BP reading is greater than 140 (top number) or greater than 90 (bottom number)  . Stomach pain under your ribs in the middle or right side . Headaches or seeing spots . Feeling nauseated or throwing up . Swelling in  face and hands  Zone 3: EMERGENCY  Seek immediate medical care if you have any of the following:  . BP reading is greater than160 (top number) or greater than 110 (bottom number) . Severe headaches not improving with Tylenol . Serious difficulty catching your breath . Any worsening symptoms from Zone 2   Second Trimester of Pregnancy The second trimester is from week 13 through week 28, months 4 through 6. The second trimester is often a time when you feel your best. Your body has also adjusted to being pregnant, and you begin to feel better physically. Usually, morning sickness has lessened or quit completely, you may have more energy, and you may have an increase in appetite. The second trimester is also a time when the fetus is growing rapidly. At the end of the sixth month, the fetus is about 9 inches long and weighs about 1 pounds. You will likely begin to feel the baby move (quickening) between 18 and 20 [place] of the pregnancy. BODY CHANGES Your body goes through many changes during pregnancy. The changes vary from woman to woman.   Your weight will continue to increase. You will notice your lower abdomen bulging out.  You may begin to get stretch marks on your hips, abdomen, and breasts.  You may develop headaches that can be relieved by medicines approved by your health care provider.  You may urinate more often because the fetus is pressing on your bladder.  You may develop or continue to have heartburn as a result of your pregnancy.  You may develop constipation because certain hormones are causing the muscles that push waste through your intestines to slow down.  You may develop hemorrhoids or swollen, bulging veins (varicose veins).  You may have back pain because of the weight gain and pregnancy hormones relaxing your joints between the bones in your pelvis and as a result of a shift in weight and the muscles that support your balance.  Your breasts will continue to grow  and be tender.  Your gums may bleed and may be sensitive to brushing and flossing.  Dark spots or blotches (chloasma, mask of pregnancy) may develop on your face. This will likely fade after the baby is born.  A dark line from your belly button to the pubic area (linea nigra) may appear. This will likely fade after the baby is born.  You may have changes in your hair. These can include thickening of your hair, rapid growth, and changes in texture. Some women also have hair loss during or after pregnancy, or hair that feels dry or thin. Your hair will most likely return to normal after your baby is born. WHAT TO EXPECT AT YOUR PRENATAL VISITS During a routine prenatal visit:  You will  be weighed to make sure you and the fetus are growing normally.  Your blood pressure will be taken.  Your abdomen will be measured to track your baby's growth.  The fetal heartbeat will be listened to.  Any test results from the previous visit will be discussed. Your health care provider may ask you:  How you are feeling.  If you are feeling the baby move.  If you have had any abnormal symptoms, such as leaking fluid, bleeding, severe headaches, or abdominal cramping.  If you have any questions. Other tests that may be performed during your second trimester include:  Blood tests that check for:  Low iron levels (anemia).  Gestational diabetes (between 24 and 28 [place]).  Rh antibodies.  Urine tests to check for infections, diabetes, or protein in the urine.  An ultrasound to confirm the proper growth and development of the baby.  An amniocentesis to check for possible genetic problems.  Fetal screens for spina bifida and Down syndrome. HOME CARE INSTRUCTIONS   Avoid all smoking, herbs, alcohol, and unprescribed drugs. These chemicals affect the formation and growth of the baby.  Follow your health care provider's instructions regarding medicine use. There are medicines that are either  safe or unsafe to take during pregnancy.  Exercise only as directed by your health care provider. Experiencing uterine cramps is a good sign to stop exercising.  Continue to eat regular, healthy meals.  Wear a good support bra for breast tenderness.  Do not use hot tubs, steam rooms, or saunas.  Wear your seat belt at all times when driving.  Avoid raw meat, uncooked cheese, cat litter boxes, and soil used by cats. These carry germs that can cause birth defects in the baby.  Take your prenatal vitamins.  Try taking a stool softener (if your health care provider approves) if you develop constipation. Eat more high-fiber foods, such as fresh vegetables or fruit and whole grains. Drink plenty of fluids to keep your urine clear or pale yellow.  Take warm sitz baths to soothe any pain or discomfort caused by hemorrhoids. Use hemorrhoid cream if your health care provider approves.  If you develop varicose veins, wear support hose. Elevate your feet for 15 minutes, 3-4 times a day. Limit salt in your diet.  Avoid heavy lifting, wear low heel shoes, and practice good posture.  Rest with your legs elevated if you have leg cramps or low back pain.  Visit your dentist if you have not gone yet during your pregnancy. Use a soft toothbrush to brush your teeth and be gentle when you floss.  A sexual relationship may be continued unless your health care provider directs you otherwise.  Continue to go to all your prenatal visits as directed by your health care provider. SEEK MEDICAL CARE IF:   You have dizziness.  You have mild pelvic cramps, pelvic pressure, or nagging pain in the abdominal area.  You have persistent nausea, vomiting, or diarrhea.  You have a bad smelling vaginal discharge.  You have pain with urination. SEEK IMMEDIATE MEDICAL CARE IF:   You have a fever.  You are leaking fluid from your vagina.  You have spotting or bleeding from your vagina.  You have severe  abdominal cramping or pain.  You have rapid weight gain or loss.  You have shortness of breath with chest pain.  You notice sudden or extreme swelling of your face, hands, ankles, feet, or legs.  You have not felt your baby move in  over an hour.  You have severe headaches that do not go away with medicine.  You have vision changes. Document Released: 04/29/2001 Document Revised: 05/10/2013 Document Reviewed: 07/06/2012 [facility] Patient Information 2015 [place], [place]. This information is not intended to replace advice given to you by your health care provider. Make sure you discuss any questions you have with your health care provider.

## 2019-10-14 ENCOUNTER — Ambulatory Visit
Admission: EM | Admit: 2019-10-14 | Discharge: 2019-10-14 | Disposition: A | Discharge status: Home / Self Care | Payer: Medicaid Other | Attending: Emergency Medicine | Admitting: Emergency Medicine

## 2019-10-14 ENCOUNTER — Other Ambulatory Visit: Payer: Self-pay

## 2019-10-14 DIAGNOSIS — Z20822 Contact with and (suspected) exposure to covid-19: Secondary | ICD-10-CM

## 2019-10-14 NOTE — ED Provider Notes (Signed)
[place]   [id number] 10/14/19 Arrival Time: 7829   CC: COVID exposure  SUBJECTIVE: History from: patient.  Theresa David is a 24 y.o. female [address] pregnant, who presents for COVID testing.  Admits to COVID exposure at work x 3 days ago.  Denies recent travel.  Denies aggravating or alleviating symptoms.  Denies previous COVID infection.   Denies fever, chills, fatigue, nasal congestion, rhinorrhea, sore throat, cough, SOB, wheezing, chest pain, nausea, vomiting, changes in bowel or bladder habits.    ROS: As per HPI.  All other pertinent ROS negative.     Past Medical History:  Diagnosis Date  . Asthma   . Bacterial vaginosis   . Eczema 09/06/2012  . Pain with urination 04/26/2015  . Seasonal allergies 09/06/2012  . Unspecified asthma(493.90) 09/06/2012  . Urinary frequency 08/01/2013  . UTI (lower urinary tract infection) 08/01/2013   Past Surgical History:  Procedure Laterality Date  . NO PAST SURGERIES     Allergies  Allergen Reactions  . Shellfish Allergy Anaphylaxis and Rash    SEAFOOD   No current facility-administered medications on file prior to encounter.   Current Outpatient Medications on File Prior to Encounter  Medication Sig Dispense Refill  . albuterol (PROAIR HFA) 108 (90 Base) MCG/ACT inhaler Inhale 2 puffs into the lungs every 4 (four) hours as needed for wheezing (Use with spacer). (Patient not taking: Reported on 09/26/2019) 18 g 0  . Blood Pressure Monitor MISC For regular home bp monitoring during pregnancy (Patient not taking: Reported on 09/26/2019) 1 each 0  . EPINEPHrine (EPIPEN) 0.3 mg/0.3 mL DEVI Inject 0.3 mLs (0.3 mg total) into the muscle once. (Patient not taking: Reported on 09/26/2019) 2 Device 1  . prenatal vitamin w/FE, FA (PRENATAL 1 + 1) 27-1 MG TABS tablet Take 1 tablet by mouth daily at 12 noon. 30 tablet 12   Social History   Socioeconomic History  . Marital status: Single    Spouse name: Not on file  . Number of  children: 1  . Years of education: Not on file  . Highest education level: Not on file  Occupational History  . Not on file  Tobacco Use  . Smoking status: Never Smoker  . Smokeless tobacco: Never Used  Substance and Sexual Activity  . Alcohol use: No  . Drug use: No  . Sexual activity: Yes    Birth control/protection: None  Other Topics Concern  . Not on file  Social History Narrative   Attends college. Wants to be pediatric nurse   Lives with mom    No  smokers   Social Determinants of Health   Financial Resource Strain:   . Difficulty of Paying Living Expenses:   Food Insecurity:   . Worried About Charity fundraiser in the Last Year:   . Arboriculturist in the Last Year:   Transportation Needs:   . Film/video editor (Medical):   Marland Kitchen Lack of Transportation (Non-Medical):   Physical Activity:   . Days of Exercise per Week:   . Minutes of Exercise per Session:   Stress:   . Feeling of Stress :   Social Connections:   . Frequency of Communication with Friends and Family:   . Frequency of Social Gatherings with Friends and Family:   . Attends Religious Services:   . Active Member of Clubs or Organizations:   . Attends Archivist Meetings:   Marland Kitchen Marital Status:   Intimate Partner Violence:   .  Fear of Current or Ex-Partner:   . Emotionally Abused:   Marland Kitchen Physically Abused:   . Sexually Abused:    Family History  Problem Relation Age of Onset  . Cancer Maternal Aunt 44       breast   . Dementia Maternal Grandmother     OBJECTIVE:  Vitals:   10/14/19 1624  BP: (!) 132/44  Pulse: (!) 103  Resp: 16  Temp: 98.8 F (37.1 C)  TempSrc: Oral  SpO2: 100%    General appearance: alert; well-appearing, nontoxic; speaking in full sentences and tolerating own secretions HEENT: NCAT; Ears: EACs clear, TMs pearly gray; Eyes: PERRL.  EOM grossly intact. Nose: nares patent without rhinorrhea, Throat: oropharynx clear, tonsils non erythematous or enlarged, uvula  midline  Neck: supple without LAD Lungs: unlabored respirations, symmetrical air entry; cough: absent; no respiratory distress; CTAB Heart: regular rate and rhythm.  Skin: warm and dry Psychological: alert and cooperative; normal mood and affect  ASSESSMENT & PLAN:  1. Exposure to COVID-19 virus     COVID testing ordered.  It will take between 2-5 days for test results.  Someone will contact you regarding abnormal results.    In the meantime:  If you were to develop symptoms: You should remain isolated in your home for 10 days from symptom onset AND greater than 72 hours after symptoms resolution (absence of fever without the use of fever-reducing medication and improvement in respiratory symptoms), whichever is longer If you have had exposure: you should remain in quarantine for 7 days from exposure.  However, if symptoms develop you must self isolate and return for retesting Get plenty of rest and push fluids Follow up with PCP as needed Call or go to the ED if you have any new symptoms such as fever, cough, shortness of breath, chest tightness, chest pain, turning blue, changes in mental status, etc...   Reviewed expectations re: course of current medical issues. Questions answered. Outlined signs and symptoms indicating need for more acute intervention. Patient verbalized understanding. After Visit Summary given.         Rennis Harding, PA-C 10/14/19 1640

## 2019-10-14 NOTE — Discharge Instructions (Signed)

## 2019-10-14 NOTE — ED Triage Notes (Signed)
Pt needs covid test after exposure at work. No symptoms

## 2019-10-15 LAB — SARS-COV-2, NAA 2 DAY TAT

## 2019-10-15 LAB — NOVEL CORONAVIRUS, NAA: SARS-CoV-2, NAA: NOT DETECTED

## 2019-10-18 ENCOUNTER — Other Ambulatory Visit: Payer: Medicaid Other

## 2019-10-24 ENCOUNTER — Other Ambulatory Visit: Payer: Medicaid Other

## 2019-10-24 ENCOUNTER — Ambulatory Visit (INDEPENDENT_AMBULATORY_CARE_PROVIDER_SITE_OTHER): Payer: Medicaid Other | Admitting: Women's Health

## 2019-10-24 ENCOUNTER — Encounter: Payer: Self-pay | Admitting: Women's Health

## 2019-10-24 VITALS — BP 126/67 | HR 76 | Wt 162.0 lb

## 2019-10-24 DIAGNOSIS — Z3482 Encounter for supervision of other normal pregnancy, second trimester: Secondary | ICD-10-CM

## 2019-10-24 DIAGNOSIS — Z3480 Encounter for supervision of other normal pregnancy, unspecified trimester: Secondary | ICD-10-CM

## 2019-10-24 DIAGNOSIS — Z23 Encounter for immunization: Secondary | ICD-10-CM

## 2019-10-24 DIAGNOSIS — Z3A26 26 weeks gestation of pregnancy: Secondary | ICD-10-CM | POA: Diagnosis not present

## 2019-10-24 DIAGNOSIS — Z131 Encounter for screening for diabetes mellitus: Secondary | ICD-10-CM

## 2019-10-24 DIAGNOSIS — Z1389 Encounter for screening for other disorder: Secondary | ICD-10-CM

## 2019-10-24 DIAGNOSIS — Z331 Pregnant state, incidental: Secondary | ICD-10-CM

## 2019-10-24 LAB — POCT URINALYSIS DIPSTICK OB
Blood, UA: NEGATIVE
Glucose, UA: NEGATIVE
Ketones, UA: NEGATIVE
Leukocytes, UA: NEGATIVE
Nitrite, UA: NEGATIVE
POC,PROTEIN,UA: NEGATIVE

## 2019-10-24 NOTE — Progress Notes (Signed)
LOW-RISK PREGNANCY VISIT Patient name: Theresa David MRN [id number]  Date of birth: [birth year] Chief Complaint:   Routine Prenatal Visit (PN2, lower back pain)  History of Present Illness:   Theresa David is a 24 y.o. G54P1001 female at [redacted]w[redacted]d with an Estimated Date of Delivery: 01/27/20 being seen today for ongoing management of a low-risk pregnancy.  Depression screen [facility] 2/9 10/24/2019 07/18/2019 12/22/2018 03/11/2017 01/18/2013  Decreased Interest 0 0 0 0 0  Down, Depressed, Hopeless 0 0 0 0 0  PHQ - 2 Score 0 0 0 0 0  Altered sleeping 0 0 - 0 2  Tired, decreased energy 1 0 - 1 1  Change in appetite 0 0 - - 0  Feeling bad or failure about yourself  0 0 - 0 1  Trouble concentrating 0 0 - 0 1  Moving slowly or fidgety/restless 0 0 - 0 2  Suicidal thoughts 0 0 - 0 0  PHQ-9 Score 1 0 - 1 7  Difficult doing work/chores Not difficult at all - - Not difficult at all -    Today she reports low back pain, apap helps. Contractions: Not present. Vag. Bleeding: None.  Movement: Present. denies leaking of fluid. Review of Systems:   Pertinent items are noted in HPI Denies abnormal vaginal discharge w/ itching/odor/irritation, headaches, visual changes, shortness of breath, chest pain, abdominal pain, severe nausea/vomiting, or problems with urination or bowel movements unless otherwise stated above. Pertinent History Reviewed:  Reviewed past medical,surgical, social, obstetrical and family history.  Reviewed problem list, medications and allergies. Physical Assessment:   Vitals:   10/24/19 0854  BP: 126/67  Pulse: 76  Weight: 162 lb (73.5 kg)  Body mass index is 26.96 kg/m.        Physical Examination:   General appearance: Well appearing, and in no distress  Mental status: Alert, oriented to person, place, and time  Skin: Warm & dry  Cardiovascular: Normal heart rate noted  Respiratory: Normal respiratory effort, no distress  Abdomen: Soft, gravid, nontender  Pelvic: Cervical exam  deferred         Extremities: Edema: None  Fetal Status: Fetal Heart Rate (bpm): 141 Fundal Height: 25 cm Movement: Present    Chaperone: n/a    Results for orders placed or performed in visit on 10/24/19 (from the past 24 hour(s))  POC Urinalysis Dipstick OB   Collection Time: 10/24/19  8:58 AM  Result Value Ref Range   Color, UA     Clarity, UA     Glucose, UA Negative Negative   Bilirubin, UA     Ketones, UA neg    Spec Grav, UA     Blood, UA neg    pH, UA     POC,PROTEIN,UA Negative Negative, Trace, Small (1+), Moderate (2+), Large (3+), 4+   Urobilinogen, UA     Nitrite, UA neg    Leukocytes, UA Negative Negative   Appearance     Odor      Assessment & Plan:  1) Low-risk pregnancy G2P1001 at [redacted]w[redacted]d with an Estimated Date of Delivery: 01/27/20   2) Low back pain, gave printed prevention/relief measures    Meds: No orders of the defined types were placed in this encounter.  Labs/procedures today: pn2, tdap  Plan:  Continue routine obstetrical care  Next visit: prefers in person    Reviewed: Preterm labor symptoms and general obstetric precautions including but not limited to vaginal bleeding, contractions, leaking of fluid and fetal movement were reviewed  in detail with the patient.  All questions were answered. Has home bp cuff. Check bp weekly, let us know if >140/90.   Follow-up: Return in about 4 [place] (around 11/21/2019) for LROB, CNM, in person.  Orders Placed This Encounter  Procedures  . Tdap vaccine greater than or equal to 7yo IM  . POC Urinalysis Dipstick OB   Cheral Marker CNM, [facility] 10/24/2019 9:10 AM

## 2019-10-24 NOTE — Patient Instructions (Signed)
Theresa David, I greatly value your feedback.  If you receive a survey following your visit with [place] today, we appreciate you taking the time to fill it out.  Thanks, [name], CNM, WHNP-BC   [facility] ([address] [place], [place] [address]) Entrance C, located off of [address] valet parking  Go to [email or web address] to register for FREE online childbirth classes   Call the office [phone]) or go to [facility] if:  You begin to have strong, frequent contractions  Your water breaks.  Sometimes it is a big gush of fluid, sometimes it is just a trickle that keeps getting your panties wet or running down your legs  You have vaginal bleeding.  It is normal to have a small amount of spotting if your cervix was checked.   You don't feel your baby moving like normal.  If you don't, get you something to eat and drink and lay down and focus on feeling your baby move.  You should feel at least 10 movements in 2 hours.  If you don't, you should call the office or go to [facility].    Tdap Vaccine  It is recommended that you get the Tdap vaccine during the third trimester of EACH pregnancy to help protect your baby from getting pertussis (whooping cough)  27-36 [place] is the BEST time to do this so that you can pass the protection on to your baby. During pregnancy is better than after pregnancy, but if you are unable to get it during pregnancy it will be offered at the hospital.   You can get this vaccine with [place], at the health department, your family doctor, or some local pharmacies  Everyone who will be around your baby should also be up-to-date on their vaccines before the baby comes. Adults (who are not pregnant) only need 1 dose of Tdap during adulthood.   For your lower back pain you may:  Purchase a pregnancy/maternity support belt from [employer], [employer], [employer], [employer], etc and wear it while you are up and  about  Take warm baths  Use a heating pad to your lower back for no longer than 20 minutes at a time, and do not place near abdomen  Take tylenol as needed. Please follow directions on the bottle  Kinesiology tape (can get from sporting goods store), google how to tape belly for pregnancy    [place] Pediatricians/Family Doctors:  [facility] [phone]            [facility] [phone]                 [facility] [phone] (usually not accepting new patients unless you have family there already, you are always welcome to call and ask)       [facility] Department [phone]       [facility] Pediatricians/Family Doctors:   [facility]: [phone]  [facility]: [phone]  [facility]: [phone]  [facility] Doctors:   [facility]: [phone]   [facility]: [phone]  [facility] Doctors:  [facility]: [phone]   Home Blood Pressure Monitoring for Patients   Your provider has recommended that you check your blood pressure (BP) at least once a week at home. If you do not have a blood pressure cuff at home, one will be provided for you. Contact your provider if you have not received your monitor within  1 week.   Helpful Tips for Accurate Home Blood Pressure Checks  . Don't smoke, exercise, or drink caffeine 30 minutes before checking your BP . Use the restroom before checking your BP (a full bladder can raise your pressure) . Relax in a comfortable upright chair . Feet on the ground . Left arm resting comfortably on a flat surface at the level of your heart . Legs uncrossed . Back supported . Sit quietly and don't talk . Place the cuff on your bare arm . Adjust snuggly, so that only two fingertips can fit between your skin and the top of the cuff . Check 2 readings separated by at least one  minute . Keep a log of your BP readings . For a visual, please reference this diagram: http://ccnc.care/bpdiagram  Provider Name: [facility]     Phone: [phone]  Zone 1: ALL CLEAR  Continue to monitor your symptoms:  . BP reading is less than 140 (top number) or less than 90 (bottom number)  . No right upper stomach pain . No headaches or seeing spots . No feeling nauseated or throwing up . No swelling in face and hands  Zone 2: CAUTION Call your doctor's office for any of the following:  . BP reading is greater than 140 (top number) or greater than 90 (bottom number)  . Stomach pain under your ribs in the middle or right side . Headaches or seeing spots . Feeling nauseated or throwing up . Swelling in face and hands  Zone 3: EMERGENCY  Seek immediate medical care if you have any of the following:  . BP reading is greater than160 (top number) or greater than 110 (bottom number) . Severe headaches not improving with Tylenol . Serious difficulty catching your breath . Any worsening symptoms from Zone 2   Third Trimester of Pregnancy The third trimester is from week 29 through week 42, months 7 through 9. The third trimester is a time when the fetus is growing rapidly. At the end of the ninth month, the fetus is about 20 inches in length and weighs 6-10 pounds.  BODY CHANGES Your body goes through many changes during pregnancy. The changes vary from woman to woman.   Your weight will continue to increase. You can expect to gain 25-35 pounds (11-16 kg) by the end of the pregnancy.  You may begin to get stretch marks on your hips, abdomen, and breasts.  You may urinate more often because the fetus is moving lower into your pelvis and pressing on your bladder.  You may develop or continue to have heartburn as a result of your pregnancy.  You may develop constipation because certain hormones are causing the muscles that push waste through your intestines to slow  down.  You may develop hemorrhoids or swollen, bulging veins (varicose veins).  You may have pelvic pain because of the weight gain and pregnancy hormones relaxing your joints between the bones in your pelvis. Backaches may result from overexertion of the muscles supporting your posture.  You may have changes in your hair. These can include thickening of your hair, rapid growth, and changes in texture. Some women also have hair loss during or after pregnancy, or hair that feels dry or thin. Your hair will most likely return to normal after your baby is born.  Your breasts will continue to grow and be tender. A yellow discharge may leak from your breasts called colostrum.  Your belly button may stick out.  You may  feel short of breath because of your expanding uterus.  You may notice the fetus "dropping," or moving lower in your abdomen.  You may have a bloody mucus discharge. This usually occurs a few days to a week before labor begins.  Your cervix becomes thin and soft (effaced) near your due date. WHAT TO EXPECT AT YOUR PRENATAL EXAMS  You will have prenatal exams every 2 Fini until week 36. Then, you will have weekly prenatal exams. During a routine prenatal visit:  You will be weighed to make sure you and the fetus are growing normally.  Your blood pressure is taken.  Your abdomen will be measured to track your baby's growth.  The fetal heartbeat will be listened to.  Any test results from the previous visit will be discussed.  You may have a cervical check near your due date to see if you have effaced. At around 36 [place], your caregiver will check your cervix. At the same time, your caregiver will also perform a test on the secretions of the vaginal tissue. This test is to determine if a type of bacteria, Group B streptococcus, is present. Your caregiver will explain this further. Your caregiver may ask you:  What your birth plan is.  How you are feeling.  If you are  feeling the baby move.  If you have had any abnormal symptoms, such as leaking fluid, bleeding, severe headaches, or abdominal cramping.  If you have any questions. Other tests or screenings that may be performed during your third trimester include:  Blood tests that check for low iron levels (anemia).  Fetal testing to check the health, activity level, and growth of the fetus. Testing is done if you have certain medical conditions or if there are problems during the pregnancy. FALSE LABOR You may feel small, irregular contractions that eventually go away. These are called Braxton Hicks contractions, or false labor. Contractions may last for hours, days, or even [place] before true labor sets in. If contractions come at regular intervals, intensify, or become painful, it is best to be seen by your caregiver.  SIGNS OF LABOR   Menstrual-like cramps.  Contractions that are 5 minutes apart or less.  Contractions that start on the top of the uterus and spread down to the lower abdomen and back.  A sense of increased pelvic pressure or back pain.  A watery or bloody mucus discharge that comes from the vagina. If you have any of these signs before the 37th week of pregnancy, call your caregiver right away. You need to go to the hospital to get checked immediately. HOME CARE INSTRUCTIONS   Avoid all smoking, herbs, alcohol, and unprescribed drugs. These chemicals affect the formation and growth of the baby.  Follow your caregiver's instructions regarding medicine use. There are medicines that are either safe or unsafe to take during pregnancy.  Exercise only as directed by your caregiver. Experiencing uterine cramps is a good sign to stop exercising.  Continue to eat regular, healthy meals.  Wear a good support bra for breast tenderness.  Do not use hot tubs, steam rooms, or saunas.  Wear your seat belt at all times when driving.  Avoid raw meat, uncooked cheese, cat litter boxes, and  soil used by cats. These carry germs that can cause birth defects in the baby.  Take your prenatal vitamins.  Try taking a stool softener (if your caregiver approves) if you develop constipation. Eat more high-fiber foods, such as fresh vegetables or fruit and  whole grains. Drink plenty of fluids to keep your urine clear or pale yellow.  Take warm sitz baths to soothe any pain or discomfort caused by hemorrhoids. Use hemorrhoid cream if your caregiver approves.  If you develop varicose veins, wear support hose. Elevate your feet for 15 minutes, 3-4 times a day. Limit salt in your diet.  Avoid heavy lifting, wear low heal shoes, and practice good posture.  Rest a lot with your legs elevated if you have leg cramps or low back pain.  Visit your dentist if you have not gone during your pregnancy. Use a soft toothbrush to brush your teeth and be gentle when you floss.  A sexual relationship may be continued unless your caregiver directs you otherwise.  Do not travel far distances unless it is absolutely necessary and only with the approval of your caregiver.  Take prenatal classes to understand, practice, and ask questions about the labor and delivery.  Make a trial run to the hospital.  Pack your hospital bag.  Prepare the baby's nursery.  Continue to go to all your prenatal visits as directed by your caregiver. SEEK MEDICAL CARE IF:  You are unsure if you are in labor or if your water has broken.  You have dizziness.  You have mild pelvic cramps, pelvic pressure, or nagging pain in your abdominal area.  You have persistent nausea, vomiting, or diarrhea.  You have a bad smelling vaginal discharge.  You have pain with urination. SEEK IMMEDIATE MEDICAL CARE IF:   You have a fever.  You are leaking fluid from your vagina.  You have spotting or bleeding from your vagina.  You have severe abdominal cramping or pain.  You have rapid weight loss or gain.  You have shortness  of breath with chest pain.  You notice sudden or extreme swelling of your face, hands, ankles, feet, or legs.  You have not felt your baby move in over an hour.  You have severe headaches that do not go away with medicine.  You have vision changes. Document Released: 04/29/2001 Document Revised: 05/10/2013 Document Reviewed: 07/06/2012 [facility] Patient Information 2015 [place], [place]. This information is not intended to replace advice given to you by your health care provider. Make sure you discuss any questions you have with your health care provider.

## 2019-10-25 LAB — CBC
Hematocrit: 33.5 % — ABNORMAL LOW (ref 34.0–46.6)
Hemoglobin: 11.3 g/dL (ref 11.1–15.9)
MCH: 31 pg (ref 26.6–33.0)
MCHC: 33.7 g/dL (ref 31.5–35.7)
MCV: 92 fL (ref 79–97)
Platelets: 263 10*3/uL (ref 150–450)
RBC: 3.64 x10E6/uL — ABNORMAL LOW (ref 3.77–5.28)
RDW: 12.2 % (ref 11.7–15.4)
WBC: 6.3 10*3/uL (ref 3.4–10.8)

## 2019-10-25 LAB — ANTIBODY SCREEN: Antibody Screen: NEGATIVE

## 2019-10-25 LAB — GLUCOSE TOLERANCE, 2 HOURS W/ 1HR
Glucose, 1 hour: 113 mg/dL (ref 65–179)
Glucose, 2 hour: 74 mg/dL (ref 65–152)
Glucose, Fasting: 71 mg/dL (ref 65–91)

## 2019-10-25 LAB — RPR: RPR Ser Ql: NONREACTIVE

## 2019-10-25 LAB — HIV ANTIBODY (ROUTINE TESTING W REFLEX): HIV Screen 4th Generation wRfx: NONREACTIVE

## 2019-11-22 ENCOUNTER — Other Ambulatory Visit: Payer: Self-pay

## 2019-11-22 ENCOUNTER — Encounter: Payer: Self-pay | Admitting: Women's Health

## 2019-11-22 ENCOUNTER — Ambulatory Visit (INDEPENDENT_AMBULATORY_CARE_PROVIDER_SITE_OTHER): Payer: Medicaid Other | Admitting: Women's Health

## 2019-11-22 VITALS — BP 126/65 | HR 87 | Wt 169.0 lb

## 2019-11-22 DIAGNOSIS — Z3A34 34 weeks gestation of pregnancy: Secondary | ICD-10-CM

## 2019-11-22 DIAGNOSIS — Z331 Pregnant state, incidental: Secondary | ICD-10-CM

## 2019-11-22 DIAGNOSIS — Z1389 Encounter for screening for other disorder: Secondary | ICD-10-CM

## 2019-11-22 DIAGNOSIS — Z3483 Encounter for supervision of other normal pregnancy, third trimester: Secondary | ICD-10-CM

## 2019-11-22 LAB — POCT URINALYSIS DIPSTICK OB
Blood, UA: NEGATIVE
Glucose, UA: NEGATIVE
Ketones, UA: NEGATIVE
Leukocytes, UA: NEGATIVE
Nitrite, UA: NEGATIVE
POC,PROTEIN,UA: NEGATIVE

## 2019-11-22 NOTE — Patient Instructions (Signed)
Theresa David, I greatly value your feedback.  If you receive a survey following your visit with us today, we appreciate you taking the time to fill it out.  Thanks, Kim Victorio Creeden, CNM, WHNP-BC  [facility] ([address] [place], [place] [address]) Entrance C, located off of [address] Free 24/7 valet parking   Go to [email or web address] to register for FREE online childbirth classes    Call the office ([phone]) or go to [facility] if:  You begin to have strong, frequent contractions  Your water breaks.  Sometimes it is a big gush of fluid, sometimes it is just a trickle that keeps getting your panties wet or running down your legs  You have vaginal bleeding.  It is normal to have a small amount of spotting if your cervix was checked.   You don't feel your baby moving like normal.  If you don't, get you something to eat and drink and lay down and focus on feeling your baby move.  You should feel at least 10 movements in 2 hours.  If you don't, you should call the office or go to [facility].   Call the office ([phone]) or go to [facility] for these signs of pre-eclampsia:  Severe headache that does not go away with Tylenol  Visual changes- seeing spots, double, blurred vision  Pain under your right breast or upper abdomen that does not go away with Tums or heartburn medicine  Nausea and/or vomiting  Severe swelling in your hands, feet, and face    Home Blood Pressure Monitoring for Patients   Your provider has recommended that you check your blood pressure (BP) at least once a week at home. If you do not have a blood pressure cuff at home, one will be provided for you. Contact your provider if you have not received your monitor within 1 week.   Helpful Tips for Accurate Home Blood Pressure Checks  . Don't smoke, exercise, or drink caffeine 30 minutes before checking your BP . Use the restroom before checking your BP (a full bladder  can raise your pressure) . Relax in a comfortable upright chair . Feet on the ground . Left arm resting comfortably on a flat surface at the level of your heart . Legs uncrossed . Back supported . Sit quietly and don't talk . Place the cuff on your bare arm . Adjust snuggly, so that only two fingertips can fit between your skin and the top of the cuff . Check 2 readings separated by at least one minute . Keep a log of your BP readings . For a visual, please reference this diagram: http://ccnc.care/bpdiagram  Provider Name: [facility]     Phone: [phone]  Zone 1: ALL CLEAR  Continue to monitor your symptoms:  . BP reading is less than 140 (top number) or less than 90 (bottom number)  . No right upper stomach pain . No headaches or seeing spots . No feeling nauseated or throwing up . No swelling in face and hands  Zone 2: CAUTION Call your doctor's office for any of the following:  . BP reading is greater than 140 (top number) or greater than 90 (bottom number)  . Stomach pain under your ribs in the middle or right side . Headaches or seeing spots . Feeling nauseated or throwing up . Swelling in face and hands  Zone 3: EMERGENCY  Seek immediate medical care if you have any of the   following:  . BP reading is greater than160 (top number) or greater than 110 (bottom number) . Severe headaches not improving with Tylenol . Serious difficulty catching your breath . Any worsening symptoms from Zone 2  Preterm Labor and Birth Information  The normal length of a pregnancy is 39-41 [place]. Preterm labor is when labor starts before 37 completed [place] of pregnancy. What are the risk factors for preterm labor? Preterm labor is more likely to occur in women who:  Have certain infections during pregnancy such as a bladder infection, sexually transmitted infection, or infection inside the uterus (chorioamnionitis).  Have a shorter-than-normal cervix.  Have gone into preterm  labor before.  Have had surgery on their cervix.  Are younger than age 69 or older than age 11.  Are African American.  Are pregnant with twins or multiple babies (multiple gestation).  Take street drugs or smoke while pregnant.  Do not gain enough weight while pregnant.  Became pregnant shortly after having been pregnant. What are the symptoms of preterm labor? Symptoms of preterm labor include:  Cramps similar to those that can happen during a menstrual period. The cramps may happen with diarrhea.  Pain in the abdomen or lower back.  Regular uterine contractions that may feel like tightening of the abdomen.  A feeling of increased pressure in the pelvis.  Increased watery or bloody mucus discharge from the vagina.  Water breaking (ruptured amniotic sac). Why is it important to recognize signs of preterm labor? It is important to recognize signs of preterm labor because babies who are born prematurely may not be fully developed. This can put them at an increased risk for:  Long-term (chronic) heart and lung problems.  Difficulty immediately after birth with regulating body systems, including blood sugar, body temperature, heart rate, and breathing rate.  Bleeding in the brain.  Cerebral palsy.  Learning difficulties.  Death. These risks are highest for babies who are born before 41 [place] of pregnancy. How is preterm labor treated? Treatment depends on the length of your pregnancy, your condition, and the health of your baby. It may involve: 1. Having a stitch (suture) placed in your cervix to prevent your cervix from opening too early (cerclage). 2. Taking or being given medicines, such as: ? Hormone medicines. These may be given early in pregnancy to help support the pregnancy. ? Medicine to stop contractions. ? Medicines to help mature the baby's lungs. These may be prescribed if the risk of delivery is high. ? Medicines to prevent your baby from developing  cerebral palsy. If the labor happens before 34 [place] of pregnancy, you may need to stay in the hospital. What should I do if I think I am in preterm labor? If you think that you are going into preterm labor, call your health care provider right away. How can I prevent preterm labor in future pregnancies? To increase your chance of having a full-term pregnancy:  Do not use any tobacco products, such as cigarettes, chewing tobacco, and e-cigarettes. If you need help quitting, ask your health care provider.  Do not use street drugs or medicines that have not been prescribed to you during your pregnancy.  Talk with your health care provider before taking any herbal supplements, even if you have been taking them regularly.  Make sure you gain a healthy amount of weight during your pregnancy.  Watch for infection. If you think that you might have an infection, get it checked right away.  Make sure to tell  your health care provider if you have gone into preterm labor before. This information is not intended to replace advice given to you by your health care provider. Make sure you discuss any questions you have with your health care provider. Document Revised: 08/27/2018 Document Reviewed: 09/26/2015 Elsevier Patient Education  [place].

## 2019-11-22 NOTE — Progress Notes (Signed)
LOW-RISK PREGNANCY VISIT Patient name: Theresa David MRN [id number]  Date of birth: 1995-12-11 Chief Complaint:   Routine Prenatal Visit  History of Present Illness:   Theresa David is a 24 y.o. G61P1001 female at [redacted]w[redacted]d with an Estimated Date of Delivery: 01/27/20 being seen today for ongoing management of a low-risk pregnancy.  Depression screen [facility] 2/9 10/24/2019 07/18/2019 12/22/2018 03/11/2017 01/18/2013  Decreased Interest 0 0 0 0 0  Down, Depressed, Hopeless 0 0 0 0 0  PHQ - 2 Score 0 0 0 0 0  Altered sleeping 0 0 - 0 2  Tired, decreased energy 1 0 - 1 1  Change in appetite 0 0 - - 0  Feeling bad or failure about yourself  0 0 - 0 1  Trouble concentrating 0 0 - 0 1  Moving slowly or fidgety/restless 0 0 - 0 2  Suicidal thoughts 0 0 - 0 0  PHQ-9 Score 1 0 - 1 7  Difficult doing work/chores Not difficult at all - - Not difficult at all -    Today she reports no complaints. Contractions: Not present. Vag. Bleeding: None.  Movement: Present. denies leaking of fluid. Review of Systems:   Pertinent items are noted in HPI Denies abnormal vaginal discharge w/ itching/odor/irritation, headaches, visual changes, shortness of breath, chest pain, abdominal pain, severe nausea/vomiting, or problems with urination or bowel movements unless otherwise stated above. Pertinent History Reviewed:  Reviewed past medical,surgical, social, obstetrical and family history.  Reviewed problem list, medications and allergies. Physical Assessment:   Vitals:   11/22/19 0852  BP: 126/65  Pulse: 87  Weight: 169 lb (76.7 kg)  Body mass index is 28.12 kg/m.        Physical Examination:   General appearance: Well appearing, and in no distress  Mental status: Alert, oriented to person, place, and time  Skin: Warm & dry  Cardiovascular: Normal heart rate noted  Respiratory: Normal respiratory effort, no distress  Abdomen: Soft, gravid, nontender  Pelvic: Cervical exam deferred         Extremities: Edema:  None  Fetal Status: Fetal Heart Rate (bpm): 152 Fundal Height: 28 cm Movement: Present    Chaperone: n/a    Results for orders placed or performed in visit on 11/22/19 (from the past 24 hour(s))  POC Urinalysis Dipstick OB   Collection Time: 11/22/19  8:54 AM  Result Value Ref Range   Color, UA     Clarity, UA     Glucose, UA Negative Negative   Bilirubin, UA     Ketones, UA neg    Spec Grav, UA     Blood, UA neg    pH, UA     POC,PROTEIN,UA Negative Negative, Trace, Small (1+), Moderate (2+), Large (3+), 4+   Urobilinogen, UA     Nitrite, UA neg    Leukocytes, UA Negative Negative   Appearance     Odor      Assessment & Plan:  1) Low-risk pregnancy G2P1001 at [redacted]w[redacted]d with an Estimated Date of Delivery: 01/27/20    Meds: No orders of the defined types were placed in this encounter.  Labs/procedures today: none  Plan:  Continue routine obstetrical care  Next visit: prefers in person    Reviewed: Preterm labor symptoms and general obstetric precautions including but not limited to vaginal bleeding, contractions, leaking of fluid and fetal movement were reviewed in detail with the patient.  All questions were answered. Has home bp cuff. Check bp weekly, let  us know if >140/90.   Follow-up: Return in about 2 [place] (around 12/06/2019) for LROB, CNM, in person.  Orders Placed This Encounter  Procedures  . POC Urinalysis Dipstick OB   Cheral Marker CNM, [facility] 11/22/2019 9:05 AM

## 2019-12-07 ENCOUNTER — Ambulatory Visit (INDEPENDENT_AMBULATORY_CARE_PROVIDER_SITE_OTHER): Payer: Medicaid Other | Admitting: Women's Health

## 2019-12-07 ENCOUNTER — Encounter: Payer: Self-pay | Admitting: Women's Health

## 2019-12-07 ENCOUNTER — Other Ambulatory Visit: Payer: Self-pay

## 2019-12-07 VITALS — BP 126/81 | HR 101 | Wt 172.8 lb

## 2019-12-07 DIAGNOSIS — O99713 Diseases of the skin and subcutaneous tissue complicating pregnancy, third trimester: Secondary | ICD-10-CM

## 2019-12-07 DIAGNOSIS — Z3A32 32 weeks gestation of pregnancy: Secondary | ICD-10-CM

## 2019-12-07 DIAGNOSIS — Z3483 Encounter for supervision of other normal pregnancy, third trimester: Secondary | ICD-10-CM

## 2019-12-07 DIAGNOSIS — L299 Pruritus, unspecified: Secondary | ICD-10-CM | POA: Diagnosis not present

## 2019-12-07 DIAGNOSIS — Z1389 Encounter for screening for other disorder: Secondary | ICD-10-CM

## 2019-12-07 DIAGNOSIS — Z331 Pregnant state, incidental: Secondary | ICD-10-CM

## 2019-12-07 DIAGNOSIS — O99719 Diseases of the skin and subcutaneous tissue complicating pregnancy, unspecified trimester: Secondary | ICD-10-CM | POA: Diagnosis not present

## 2019-12-07 LAB — POCT URINALYSIS DIPSTICK OB
Blood, UA: NEGATIVE
Glucose, UA: NEGATIVE
Ketones, UA: NEGATIVE
Leukocytes, UA: NEGATIVE
Nitrite, UA: NEGATIVE
POC,PROTEIN,UA: NEGATIVE

## 2019-12-07 NOTE — Patient Instructions (Signed)
Theresa David, I greatly value your feedback.  If you receive a survey following your visit with [place] today, we appreciate you taking the time to fill it out.  Thanks, Theresa David, CNM, WHNP-BC  [facility] ([address] [place], [place] [address]) Entrance C, located off of [address] valet parking   Go to [email or web address] to register for FREE online childbirth classes    Call the office [phone]) or go to [facility] if:  You begin to have strong, frequent contractions  Your water breaks.  Sometimes it is a big gush of fluid, sometimes it is just a trickle that keeps getting your panties wet or running down your legs  You have vaginal bleeding.  It is normal to have a small amount of spotting if your cervix was checked.   You don't feel your baby moving like normal.  If you don't, get you something to eat and drink and lay down and focus on feeling your baby move.  You should feel at least 10 movements in 2 hours.  If you don't, you should call the office or go to [facility].   Call the office [phone]) or go to [facility] hospital for these signs of pre-eclampsia:  Severe headache that does not go away with Tylenol  Visual changes- seeing spots, double, blurred vision  Pain under your right breast or upper abdomen that does not go away with Tums or heartburn medicine  Nausea and/or vomiting  Severe swelling in your hands, feet, and face    Home Blood Pressure Monitoring for Patients   Your provider has recommended that you check your blood pressure (BP) at least once a week at home. If you do not have a blood pressure cuff at home, one will be provided for you. Contact your provider if you have not received your monitor within 1 week.   Helpful Tips for Accurate Home Blood Pressure Checks  . Don't smoke, exercise, or drink caffeine 30 minutes before checking your BP . Use the restroom before checking your BP (a full bladder  can raise your pressure) . Relax in a comfortable upright chair . Feet on the ground . Left arm resting comfortably on a flat surface at the level of your heart . Legs uncrossed . Back supported . Sit quietly and don't talk . Place the cuff on your bare arm . Adjust snuggly, so that only two fingertips can fit between your skin and the top of the cuff . Check 2 readings separated by at least one minute . Keep a log of your BP readings . For a visual, please reference this diagram: http://ccnc.care/bpdiagram  Provider Name: [facility]     Phone: [phone]  Zone 1: ALL CLEAR  Continue to monitor your symptoms:  . BP reading is less than 140 (top number) or less than 90 (bottom number)  . No right upper stomach pain . No headaches or seeing spots . No feeling nauseated or throwing up . No swelling in face and hands  Zone 2: CAUTION Call your doctor's office for any of the following:  . BP reading is greater than 140 (top number) or greater than 90 (bottom number)  . Stomach pain under your ribs in the middle or right side . Headaches or seeing spots . Feeling nauseated or throwing up . Swelling in face and hands  Zone 3: EMERGENCY  Seek immediate medical care if you have any of the  following:  . BP reading is greater than160 (top number) or greater than 110 (bottom number) . Severe headaches not improving with Tylenol . Serious difficulty catching your breath . Any worsening symptoms from Zone 2  Preterm Labor and Birth Information  The normal length of a pregnancy is 39-41 [place]. Preterm labor is when labor starts before 37 completed [place] of pregnancy. What are the risk factors for preterm labor? Preterm labor is more likely to occur in women who:  Have certain infections during pregnancy such as a bladder infection, sexually transmitted infection, or infection inside the uterus (chorioamnionitis).  Have a shorter-than-normal cervix.  Have gone into preterm  labor before.  Have had surgery on their cervix.  Are younger than age 69 or older than age 11.  Are African American.  Are pregnant with twins or multiple babies (multiple gestation).  Take street drugs or smoke while pregnant.  Do not gain enough weight while pregnant.  Became pregnant shortly after having been pregnant. What are the symptoms of preterm labor? Symptoms of preterm labor include:  Cramps similar to those that can happen during a menstrual period. The cramps may happen with diarrhea.  Pain in the abdomen or lower back.  Regular uterine contractions that may feel like tightening of the abdomen.  A feeling of increased pressure in the pelvis.  Increased watery or bloody mucus discharge from the vagina.  Water breaking (ruptured amniotic sac). Why is it important to recognize signs of preterm labor? It is important to recognize signs of preterm labor because babies who are born prematurely may not be fully developed. This can put them at an increased risk for:  Long-term (chronic) heart and lung problems.  Difficulty immediately after birth with regulating body systems, including blood sugar, body temperature, heart rate, and breathing rate.  Bleeding in the brain.  Cerebral palsy.  Learning difficulties.  Death. These risks are highest for babies who are born before 41 [place] of pregnancy. How is preterm labor treated? Treatment depends on the length of your pregnancy, your condition, and the health of your baby. It may involve: 1. Having a stitch (suture) placed in your cervix to prevent your cervix from opening too early (cerclage). 2. Taking or being given medicines, such as: ? Hormone medicines. These may be given early in pregnancy to help support the pregnancy. ? Medicine to stop contractions. ? Medicines to help mature the baby's lungs. These may be prescribed if the risk of delivery is high. ? Medicines to prevent your baby from developing  cerebral palsy. If the labor happens before 34 [place] of pregnancy, you may need to stay in the hospital. What should I do if I think I am in preterm labor? If you think that you are going into preterm labor, call your health care provider right away. How can I prevent preterm labor in future pregnancies? To increase your chance of having a full-term pregnancy:  Do not use any tobacco products, such as cigarettes, chewing tobacco, and e-cigarettes. If you need help quitting, ask your health care provider.  Do not use street drugs or medicines that have not been prescribed to you during your pregnancy.  Talk with your health care provider before taking any herbal supplements, even if you have been taking them regularly.  Make sure you gain a healthy amount of weight during your pregnancy.  Watch for infection. If you think that you might have an infection, get it checked right away.  Make sure to tell  your health care provider if you have gone into preterm labor before. This information is not intended to replace advice given to you by your health care provider. Make sure you discuss any questions you have with your health care provider. Document Revised: 08/27/2018 Document Reviewed: 09/26/2015 Elsevier Patient Education  [place].

## 2019-12-07 NOTE — Progress Notes (Signed)
LOW-RISK PREGNANCY VISIT Patient name: Theresa David MRN [id number]  Date of birth: 1995/08/01 Chief Complaint:   Routine Prenatal Visit  History of Present Illness:   Theresa David is a 24 y.o. G16P1001 female at 77w5dwith an Estimated Date of Delivery: 01/27/20 being seen today for ongoing management of a low-risk pregnancy.  [facility] 10/24/2019 07/18/2019 12/22/2018 03/11/2017 01/18/2013  Decreased Interest 0 0 0 0 0  Down, Depressed, Hopeless 0 0 0 0 0  PHQ - 2 Score 0 0 0 0 0  Altered sleeping 0 0 - 0 2  Tired, decreased energy 1 0 - 1 1  Change in appetite 0 0 - - 0  Feeling bad or failure about yourself  0 0 - 0 1  Trouble concentrating 0 0 - 0 1  Moving slowly or fidgety/restless 0 0 - 0 2  Suicidal thoughts 0 0 - 0 0  PHQ-9 Score 1 0 - 1 7  Difficult doing work/chores Not difficult at all - - Not difficult at all -    Today she reports itching on soles of feet at night, just like when she had ICP last pregnancy. Notified me on mychart this week, so has been fasting since MN, will check labs today. Benadryl and Aquaphor did help. Contractions: Not present. Vag. Bleeding: None.  Movement: Present. denies leaking of fluid. Review of Systems:   Pertinent items are noted in HPI Denies abnormal vaginal discharge w/ itching/odor/irritation, headaches, visual changes, shortness of breath, chest pain, abdominal pain, severe nausea/vomiting, or problems with urination or bowel movements unless otherwise stated above. Pertinent History Reviewed:  Reviewed past medical,surgical, social, obstetrical and family history.  Reviewed problem list, medications and allergies. Physical Assessment:   Vitals:   12/07/19 0842  BP: 126/81  Pulse: (!) 101  Weight: 172 lb 12.8 oz (78.4 kg)  Body mass index is 28.76 kg/m.        Physical Examination:   General appearance: Well appearing, and in no distress  Mental status: Alert, oriented to person, place, and time  Skin: Warm &  dry  Cardiovascular: Normal heart rate noted  Respiratory: Normal respiratory effort, no distress  Abdomen: Soft, gravid, nontender  Pelvic: Cervical exam deferred         Extremities: Edema: Trace  Fetal Status: Fetal Heart Rate (bpm): 160 Fundal Height: 30 cm Movement: Present    Chaperone: n/a    Results for orders placed or performed in visit on 12/07/19 (from the past 24 hour(s))  POC Urinalysis Dipstick OB   Collection Time: 12/07/19  8:40 AM  Result Value Ref Range   Color, UA     Clarity, UA     Glucose, UA Negative Negative   Bilirubin, UA     Ketones, UA n    Spec Grav, UA     Blood, UA n    pH, UA     POC,PROTEIN,UA Negative Negative, Trace, Small (1+), Moderate (2+), Large (3+), 4+   Urobilinogen, UA     Nitrite, UA n    Leukocytes, UA Negative Negative   Appearance     Odor      Assessment & Plan:  1) Low-risk pregnancy G2P1001 at 371w5dith an Estimated Date of Delivery: 01/27/20   2) Itching on soles of feet, worse at night, check fasting bile acids and CMP today. Already given info to help w/ itching   Meds: No orders of the defined types were placed in this encounter.  Labs/procedures today: CMP, bile acids  Plan:  Continue routine obstetrical care  Next visit: prefers in person    Reviewed: Preterm labor symptoms and general obstetric precautions including but not limited to vaginal bleeding, contractions, leaking of fluid and fetal movement were reviewed in detail with the patient.  All questions were answered. Has home bp cuff.  Check bp weekly, let us know if >140/90.   Follow-up: Return in about 2 [place] (around 12/21/2019) for LROB, CNM, in person.  Orders Placed This Encounter  Procedures  . Bile acids, total  . Comp Met (CMET)  . POC Urinalysis Dipstick OB   Roma Schanz CNM, [facility] 12/07/2019 8:53 AM

## 2019-12-09 LAB — COMPREHENSIVE METABOLIC PANEL
ALT: 12 IU/L (ref 0–32)
AST: 18 IU/L (ref 0–40)
Albumin/Globulin Ratio: 1 — ABNORMAL LOW (ref 1.2–2.2)
Albumin: 3.6 g/dL — ABNORMAL LOW (ref 3.9–5.0)
Alkaline Phosphatase: 145 IU/L — ABNORMAL HIGH (ref 48–121)
BUN/Creatinine Ratio: 15 (ref 9–23)
BUN: 9 mg/dL (ref 6–20)
Bilirubin Total: 0.3 mg/dL (ref 0.0–1.2)
CO2: 21 mmol/L (ref 20–29)
Calcium: 9.3 mg/dL (ref 8.7–10.2)
Chloride: 104 mmol/L (ref 96–106)
Creatinine, Ser: 0.62 mg/dL (ref 0.57–1.00)
GFR calc Af Amer: 148 mL/min/{1.73_m2} (ref >59)
GFR calc non Af Amer: 128 mL/min/{1.73_m2} (ref >59)
Globulin, Total: 3.5 g/dL (ref 1.5–4.5)
Glucose: 74 mg/dL (ref 65–99)
Potassium: 5.1 mmol/L (ref 3.5–5.2)
Sodium: 139 mmol/L (ref 134–144)
Total Protein: 7.1 g/dL (ref 6.0–8.5)

## 2019-12-09 LAB — BILE ACIDS, TOTAL: Bile Acids Total: 1.5 umol/L (ref 0.0–10.0)

## 2019-12-21 ENCOUNTER — Encounter: Payer: Self-pay | Admitting: Women's Health

## 2019-12-21 ENCOUNTER — Ambulatory Visit (INDEPENDENT_AMBULATORY_CARE_PROVIDER_SITE_OTHER): Payer: Medicaid Other | Admitting: Women's Health

## 2019-12-21 VITALS — BP 120/74 | HR 89 | Wt 176.0 lb

## 2019-12-21 DIAGNOSIS — Z3483 Encounter for supervision of other normal pregnancy, third trimester: Secondary | ICD-10-CM

## 2019-12-21 DIAGNOSIS — Z3480 Encounter for supervision of other normal pregnancy, unspecified trimester: Secondary | ICD-10-CM

## 2019-12-21 DIAGNOSIS — O26843 Uterine size-date discrepancy, third trimester: Secondary | ICD-10-CM

## 2019-12-21 DIAGNOSIS — Z3A34 34 weeks gestation of pregnancy: Secondary | ICD-10-CM

## 2019-12-21 DIAGNOSIS — Z331 Pregnant state, incidental: Secondary | ICD-10-CM

## 2019-12-21 DIAGNOSIS — Z1389 Encounter for screening for other disorder: Secondary | ICD-10-CM

## 2019-12-21 LAB — POCT URINALYSIS DIPSTICK OB
Blood, UA: NEGATIVE
Glucose, UA: NEGATIVE
Ketones, UA: NEGATIVE
Leukocytes, UA: NEGATIVE
Nitrite, UA: NEGATIVE
POC,PROTEIN,UA: NEGATIVE

## 2019-12-21 NOTE — Progress Notes (Signed)
LOW-RISK PREGNANCY VISIT Patient name: Theresa David MRN [id number]  Date of birth: Dec 03, 1995 Chief Complaint:   Routine Prenatal Visit  History of Present Illness:   Theresa David is a 24 y.o. G64P1001 female at [redacted]w[redacted]d with an Estimated Date of Delivery: 01/27/20 being seen today for ongoing management of a low-risk pregnancy.  Depression screen [facility] 2/9 10/24/2019 07/18/2019 12/22/2018 03/11/2017 01/18/2013  Decreased Interest 0 0 0 0 0  Down, Depressed, Hopeless 0 0 0 0 0  PHQ - 2 Score 0 0 0 0 0  Altered sleeping 0 0 - 0 2  Tired, decreased energy 1 0 - 1 1  Change in appetite 0 0 - - 0  Feeling bad or failure about yourself  0 0 - 0 1  Trouble concentrating 0 0 - 0 1  Moving slowly or fidgety/restless 0 0 - 0 2  Suicidal thoughts 0 0 - 0 0  PHQ-9 Score 1 0 - 1 7  Difficult doing work/chores Not difficult at all - - Not difficult at all -    Today she reports no complaints. Contractions: Not present. Vag. Bleeding: None.  Movement: Present. denies leaking of fluid. Review of Systems:   Pertinent items are noted in HPI Denies abnormal vaginal discharge w/ itching/odor/irritation, headaches, visual changes, shortness of breath, chest pain, abdominal pain, severe nausea/vomiting, or problems with urination or bowel movements unless otherwise stated above. Pertinent History Reviewed:  Reviewed past medical,surgical, social, obstetrical and family history.  Reviewed problem list, medications and allergies. Physical Assessment:   Vitals:   12/21/19 0843  BP: 120/74  Pulse: 89  Weight: 176 lb (79.8 kg)  Body mass index is 29.29 kg/m.        Physical Examination:   General appearance: Well appearing, and in no distress  Mental status: Alert, oriented to person, place, and time  Skin: Warm & dry  Cardiovascular: Normal heart rate noted  Respiratory: Normal respiratory effort, no distress  Abdomen: Soft, gravid, nontender  Pelvic: Cervical exam deferred         Extremities: Edema:  Trace  Fetal Status: Fetal Heart Rate (bpm): 141 Fundal Height: 30 cm Movement: Present    Chaperone: n/a    Results for orders placed or performed in visit on 12/21/19 (from the past 24 hour(s))  POC Urinalysis Dipstick OB   Collection Time: 12/21/19  8:47 AM  Result Value Ref Range   Color, UA     Clarity, UA     Glucose, UA Negative Negative   Bilirubin, UA     Ketones, UA neg    Spec Grav, UA     Blood, UA neg    pH, UA     POC,PROTEIN,UA Negative Negative, Trace, Small (1+), Moderate (2+), Large (3+), 4+   Urobilinogen, UA     Nitrite, UA neg    Leukocytes, UA Negative Negative   Appearance     Odor      Assessment & Plan:  1) Low-risk pregnancy G2P1001 at [redacted]w[redacted]d with an Estimated Date of Delivery: 01/27/20   2) Size <dates, will get efw/afi u/s   Meds: No orders of the defined types were placed in this encounter.  Labs/procedures today: none  Plan:  Continue routine obstetrical care  Next visit: prefers in person    Reviewed: Preterm labor symptoms and general obstetric precautions including but not limited to vaginal bleeding, contractions, leaking of fluid and fetal movement were reviewed in detail with the patient.  All questions were answered.  Has home bp cuff. Check bp weekly, let us know if >140/90.   Follow-up: Return in about 2 Grist (around 01/04/2020) for LROB, CNM, in person; ASAP efw u/s.  Orders Placed This Encounter  Procedures   US OB Follow Up   POC Urinalysis Dipstick OB   Cheral Marker CNM, [facility] 12/21/2019 9:03 AM

## 2019-12-21 NOTE — Patient Instructions (Addendum)
Theresa David, I greatly value your feedback.  If you receive a survey following your visit with [place] today, we appreciate you taking the time to fill it out.  Thanks, [name], CNM, WHNP-BC  [facility] ([address] [place], [place] [address]) Entrance C, located off of [address] valet parking   Go to [email or web address] to register for FREE online childbirth classes    Call the office [phone]) or go to [facility] if:  You begin to have strong, frequent contractions  Your water breaks.  Sometimes it is a big gush of fluid, sometimes it is just a trickle that keeps getting your panties wet or running down your legs  You have vaginal bleeding.  It is normal to have a small amount of spotting if your cervix was checked.   You don't feel your baby moving like normal.  If you don't, get you something to eat and drink and lay down and focus on feeling your baby move.  You should feel at least 10 movements in 2 hours.  If you don't, you should call the office or go to [facility].   Call the office [phone]) or go to [facility] hospital for these signs of pre-eclampsia:  Severe headache that does not go away with Tylenol  Visual changes- seeing spots, double, blurred vision  Pain under your right breast or upper abdomen that does not go away with Tums or heartburn medicine  Nausea and/or vomiting  Severe swelling in your hands, feet, and face    Home Blood Pressure Monitoring for Patients   Your provider has recommended that you check your blood pressure (BP) at least once a week at home. If you do not have a blood pressure cuff at home, one will be provided for you. Contact your provider if you have not received your monitor within 1 week.   Helpful Tips for Accurate Home Blood Pressure Checks  . Don't smoke, exercise, or drink caffeine 30 minutes before checking your BP . Use the restroom before checking your BP (a full bladder  can raise your pressure) . Relax in a comfortable upright chair . Feet on the ground . Left arm resting comfortably on a flat surface at the level of your heart . Legs uncrossed . Back supported . Sit quietly and don't talk . Place the cuff on your bare arm . Adjust snuggly, so that only two fingertips can fit between your skin and the top of the cuff . Check 2 readings separated by at least one minute . Keep a log of your BP readings . For a visual, please reference this diagram: http://ccnc.care/bpdiagram  Provider Name: [facility]     Phone: [phone]  Zone 1: ALL CLEAR  Continue to monitor your symptoms:  . BP reading is less than 140 (top number) or less than 90 (bottom number)  . No right upper stomach pain . No headaches or seeing spots . No feeling nauseated or throwing up . No swelling in face and hands  Zone 2: CAUTION Call your doctor's office for any of the following:  . BP reading is greater than 140 (top number) or greater than 90 (bottom number)  . Stomach pain under your ribs in the middle or right side . Headaches or seeing spots . Feeling nauseated or throwing up . Swelling in face and hands  Zone 3: EMERGENCY  Seek immediate medical care if you have any of the  following:  . BP reading is greater than160 (top number) or greater than 110 (bottom number) . Severe headaches not improving with Tylenol . Serious difficulty catching your breath . Any worsening symptoms from Zone 2  Preterm Labor and Birth Information  The normal length of a pregnancy is 39-41 Hasz. Preterm labor is when labor starts before 37 completed [place] of pregnancy. What are the risk factors for preterm labor? Preterm labor is more likely to occur in women who:  Have certain infections during pregnancy such as a bladder infection, sexually transmitted infection, or infection inside the uterus (chorioamnionitis).  Have a shorter-than-normal cervix.  Have gone into preterm  labor before.  Have had surgery on their cervix.  Are younger than age 69 or older than age 11.  Are African American.  Are pregnant with twins or multiple babies (multiple gestation).  Take street drugs or smoke while pregnant.  Do not gain enough weight while pregnant.  Became pregnant shortly after having been pregnant. What are the symptoms of preterm labor? Symptoms of preterm labor include:  Cramps similar to those that can happen during a menstrual period. The cramps may happen with diarrhea.  Pain in the abdomen or lower back.  Regular uterine contractions that may feel like tightening of the abdomen.  A feeling of increased pressure in the pelvis.  Increased watery or bloody mucus discharge from the vagina.  Water breaking (ruptured amniotic sac). Why is it important to recognize signs of preterm labor? It is important to recognize signs of preterm labor because babies who are born prematurely may not be fully developed. This can put them at an increased risk for:  Long-term (chronic) heart and lung problems.  Difficulty immediately after birth with regulating body systems, including blood sugar, body temperature, heart rate, and breathing rate.  Bleeding in the brain.  Cerebral palsy.  Learning difficulties.  Death. These risks are highest for babies who are born before 41 [place] of pregnancy. How is preterm labor treated? Treatment depends on the length of your pregnancy, your condition, and the health of your baby. It may involve: 1. Having a stitch (suture) placed in your cervix to prevent your cervix from opening too early (cerclage). 2. Taking or being given medicines, such as: ? Hormone medicines. These may be given early in pregnancy to help support the pregnancy. ? Medicine to stop contractions. ? Medicines to help mature the baby's lungs. These may be prescribed if the risk of delivery is high. ? Medicines to prevent your baby from developing  cerebral palsy. If the labor happens before 34 [place] of pregnancy, you may need to stay in the hospital. What should I do if I think I am in preterm labor? If you think that you are going into preterm labor, call your health care provider right away. How can I prevent preterm labor in future pregnancies? To increase your chance of having a full-term pregnancy:  Do not use any tobacco products, such as cigarettes, chewing tobacco, and e-cigarettes. If you need help quitting, ask your health care provider.  Do not use street drugs or medicines that have not been prescribed to you during your pregnancy.  Talk with your health care provider before taking any herbal supplements, even if you have been taking them regularly.  Make sure you gain a healthy amount of weight during your pregnancy.  Watch for infection. If you think that you might have an infection, get it checked right away.  Make sure to tell  your health care provider if you have gone into preterm labor before. This information is not intended to replace advice given to you by your health care provider. Make sure you discuss any questions you have with your health care provider. Document Revised: 08/27/2018 Document Reviewed: 09/26/2015 Elsevier Patient Education  2020 [employer].  COVID-19 Vaccines While Pregnant or Breastfeeding Updated November 15, 2019  Pregnant and recently pregnant people are more likely to get severely ill with COVID-19 compared with non-pregnant people. If you are pregnant, you can receive a COVID-19 vaccine. Getting a COVID-19 vaccine during pregnancy can protect you from severe illness from COVID-19. If you have questions about getting vaccinated, a conversation with your healthcare provider might help, but is not required for vaccination.  Pregnant and Recently Pregnant People Are at Increased Risk for Severe Illness from COVID-19 Although the overall risk of severe illness is low, pregnant and recently  pregnant people are at an increased risk for severe illness from COVID-19 when compared with non-pregnant people. Severe illness includes illness that requires hospitalization, intensive care, or a ventilator or special equipment to breathe, or illness that results in death. Additionally, pregnant people with COVID-19 are at increased risk of preterm birth and might be at increased risk of other adverse pregnancy outcomes compared with pregnant women without COVID-19.  If you are facing a decision about whether to receive a COVID-19 vaccine while pregnant, consider:  Your risk of exposure to COVID-19 The risks of severe illness The known benefits of vaccination The limited but growing evidence about the safety of vaccinations during pregnancy Limited Data Are Available about the Safety of COVID-19 Vaccines for People Who Are Pregnant Based on how these vaccines work in the body, experts believe they are unlikely to pose a risk for people who are pregnant. However, there are currently limited data on the safety of COVID-19 vaccines in pregnant people.  Clinical trials that study the safety of COVID-19 vaccines and how well they work in pregnant people are underway or planned. Vaccine manufacturers are also collecting and reviewing data from people in the completed clinical trials who received vaccine and became pregnant. Studies in animals receiving a Moderna, Pfizer-BioNTech, or J&J/Janssen COVID-19 vaccine before or during pregnancy found no safety concerns in pregnant animals or their babies. The Centers for Disease Control and Prevention (CDC) and the [employer] (FDA) have safety monitoring systems in place to gather information about COVID-19 vaccination during pregnancy and will closely monitor that information. Early dataexternal icon from these systems are preliminary, but reassuring. These data did not identify any safety concerns for pregnant people who were vaccinated or for  their babies. Most of the pregnancies reported in these systems are ongoing, so more follow-up data are needed for people vaccinated just before or early in pregnancy. We will continue to follow people vaccinated during all trimesters of pregnancy to understand effects on pregnancy and babies.  The Moderna and Pfizer-BioNTech vaccines are mRNA vaccines that do not contain the live virus that causes COVID-19 and therefore, cannot give someone COVID-19. Additionally, mRNA vaccines do not interact with a person's DNA or cause genetic changes because the mRNA does not enter the nucleus of the cell, which is where our DNA is kept. Learn more about how COVID-19 mRNA vaccines work.  The J&J/Janssen COVID-19 Vaccine is a viral vector vaccine, meaning it uses a modified version of a different virus (the vector) to deliver important instructions to our cells. Vaccines that use the same viral  vector have been given to pregnant people in all trimesters of pregnancy, including in a large-scale Ebola vaccination trial. No adverse pregnancy-related outcomes, including adverse outcomes that affected the infant, were associated with vaccination in these trials. Learn more about how viral vector vaccines work.  Johnson [employer]'s [place] [employer] (J&J/Janssen) COVID-19 Vaccine: The Centers for Disease Control and Prevention (CDC) and the [place] Food and Drug Administration (FDA) recommended that use of (J&J/Janssen) COVID-19 Vaccine resume in the [place], effective September 09, 2019. However, women younger than 23 years old should especially be aware of the rare risk of blood clots with low platelets after vaccination. There are other COVID-19 vaccines available for which this risk has not been seen. If you received a J&J/Janssen COVID-19 Vaccine, here is what you need to know. Read the CDC/FDA statement.  If you are pregnant and receive a COVID-19 vaccine, consider participating in the v-safe pregnancy registry If you are pregnant  and have received a COVID-19 vaccine, we encourage you to enroll in v-safe. V-safe is CDC's smartphone-based tool that uses text messaging and web surveys to provide personalized health check-ins after vaccination. A v-safe pregnancy registry has been established to gather information on the health of pregnant people who have received a COVID-19 vaccine. If people enrolled in v-safe report that they were pregnant at the time of vaccination or after vaccination, the registry staff might contact them to learn more. Participation is voluntary, and participants may opt out at any time.  Getting Vaccinated is a Optician, dispensing If you are pregnant, you can receive a COVID-19 vaccine. You may want to have a conversation with your healthcare provider to help you decide whether to receive a vaccine that has been authorized for use under Emergency Use Authorization. While a conversation with your healthcare provider may be helpful, it is not required prior to vaccination.  Key considerations you can discuss with your healthcare provider include:  How likely you are to being exposed to the virus that causes COVID-19 Risks of COVID-19 to you and the potential risks to your fetus or infant What is known about COVID-19 vaccines: How well they work to develop protection in the body Known side effects of vaccination Limited, but growing, information on the safety of COVID-19 vaccination during pregnancy How vaccination might pass antibodies to the fetus. Recent reports have shown that people who have received COVID-19 mRNA vaccines during pregnancy (mostly during their third trimester) have passed antibodies to their fetuses, which could help protect them after birth. If you are pregnant and have questions about COVID-19 vaccine If you would like to speak to someone about COVID-19 vaccination during pregnancy, please contact [employer]ToBaby. [employer]To[employer] experts are available to answer questions in [place] or Spanish by  phone or chat. The free and confidential service is available Monday-Friday 8am-5pm (local time). To reach [employer]:  Call [phone] Chat live or send an email MotherToBabyexternal icon Follow Recommendations to Prevent the Spread of COVID-19 after Vaccination If you are pregnant and decide to get vaccinated:  After you are fully vaccinated, you can resume activities that you did prior to the pandemic. Learn more about what you can do when you have been fully vaccinated.  If you have a condition or are taking medications that weaken your immune system, you may NOT be fully protected even if you are fully vaccinated. Talk to your healthcare provider. Even after vaccination, you may need to continue taking all precautions.  Vaccine Side Effects Side effects can occur after receiving any  of the available COVID-19 vaccines, especially after the second dose for vaccines that require two doses. Pregnant people have not reported different side effects from non-pregnant people after vaccination with mRNA vaccines (Moderna and Pfizer-BioNTech vaccines). If you experience fever following vaccination you should take acetaminophen (Tylenol) because fever --for any reason-- has been associated with adverse pregnancy outcomes. Learn more at What to Expect after Getting a COVID-19 Vaccine.  Although rare, some people have had allergic reactions after receiving a COVID-19 vaccine. Talk with your healthcare provider if you have a history of allergic reaction to any other vaccine or injectable therapy (intramuscular, intravenous, or subcutaneous).  Key considerations you can discuss with your healthcare provider include:  The unknown risks of developing a severe allergic reaction The benefits of vaccination If you have an allergic reaction after receiving a COVID-19 vaccine during pregnancy, you can receive treatment for it.  People Who Are Breastfeeding Clinical trials for the COVID-19 vaccines  currently authorized for use under an Emergency Use Authorization in the [place] did not include people who are breastfeeding. Because the vaccines have not been studied on lactating people, there are no data available on the:  Safety of COVID-19 vaccines in lactating people Effects of vaccination on the breastfed baby Effects on milk production or excretion Based on how these vaccines work in the body, COVID-19 vaccines are thought not to be a risk to lactating people or their breastfeeding babies. Therefore, lactating people can receive a COVID-19 vaccine. Recent reports have shown that breastfeeding people who have received COVID-19 mRNA vaccines have antibodies in their breastmilk, which could help protect their babies. More data are needed to determine what protection these antibodies may provide to the baby.  People Who Would Like to Have a Baby If trying to get pregnant now or in the future, would-be parents can receive a COVID-19 vaccine.  There is currently no evidence that any vaccines, including COVID-19 vaccines, cause female or female fertility problems--problems getting pregnant. CDC does not recommend routine pregnancy testing before COVID-19 vaccination. If you are trying to become pregnant, you do not need to avoid pregnancy after receiving a COVID-19 vaccine. Like with all vaccines, scientists are studying COVID-19 vaccines carefully for side effects now and will report findings as they become available.

## 2019-12-22 ENCOUNTER — Ambulatory Visit (INDEPENDENT_AMBULATORY_CARE_PROVIDER_SITE_OTHER): Payer: Medicaid Other

## 2019-12-22 ENCOUNTER — Other Ambulatory Visit: Payer: Self-pay

## 2019-12-22 DIAGNOSIS — Z3480 Encounter for supervision of other normal pregnancy, unspecified trimester: Secondary | ICD-10-CM

## 2019-12-22 DIAGNOSIS — Z3483 Encounter for supervision of other normal pregnancy, third trimester: Secondary | ICD-10-CM | POA: Diagnosis not present

## 2019-12-22 DIAGNOSIS — O26843 Uterine size-date discrepancy, third trimester: Secondary | ICD-10-CM | POA: Diagnosis not present

## 2019-12-22 NOTE — Progress Notes (Signed)
[place] 34+6 wks,cephalic,anterior placenta gr 2,fhr 166 bpm,EFW 2148 g 11%,AC 4.9%,F/U ultrasound in 3 wks per [name]

## 2020-01-04 ENCOUNTER — Other Ambulatory Visit (HOSPITAL_COMMUNITY)
Admission: RE | Admit: 2020-01-04 | Discharge: 2020-01-04 | Disposition: A | Discharge status: Home / Self Care | Payer: Medicaid Other | Source: Ambulatory Visit | Attending: Advanced Practice Midwife | Admitting: Advanced Practice Midwife

## 2020-01-04 ENCOUNTER — Other Ambulatory Visit: Payer: Self-pay

## 2020-01-04 ENCOUNTER — Encounter: Payer: Self-pay | Admitting: Advanced Practice Midwife

## 2020-01-04 ENCOUNTER — Ambulatory Visit (INDEPENDENT_AMBULATORY_CARE_PROVIDER_SITE_OTHER): Payer: Medicaid Other | Admitting: Advanced Practice Midwife

## 2020-01-04 VITALS — BP 117/79 | HR 83

## 2020-01-04 DIAGNOSIS — Z3480 Encounter for supervision of other normal pregnancy, unspecified trimester: Secondary | ICD-10-CM | POA: Insufficient documentation

## 2020-01-04 DIAGNOSIS — Z331 Pregnant state, incidental: Secondary | ICD-10-CM

## 2020-01-04 DIAGNOSIS — Z3A36 36 weeks gestation of pregnancy: Secondary | ICD-10-CM

## 2020-01-04 DIAGNOSIS — Z1389 Encounter for screening for other disorder: Secondary | ICD-10-CM | POA: Diagnosis not present

## 2020-01-04 LAB — POCT URINALYSIS DIPSTICK OB
Blood, UA: NEGATIVE
Glucose, UA: NEGATIVE
Ketones, UA: NEGATIVE
Leukocytes, UA: NEGATIVE
Nitrite, UA: NEGATIVE
POC,PROTEIN,UA: NEGATIVE

## 2020-01-04 NOTE — Patient Instructions (Signed)
Braxton Hicks Contractions °Contractions of the uterus can occur throughout pregnancy, but they are not always a sign that you are in labor. You may have practice contractions called Braxton Hicks contractions. These false labor contractions are sometimes confused with true labor. °What are Braxton Hicks contractions? °Braxton Hicks contractions are tightening movements that occur in the muscles of the uterus before labor. Unlike true labor contractions, these contractions do not result in opening (dilation) and thinning of the cervix. Toward the end of pregnancy (32-34 [place]), Braxton Hicks contractions can happen more often and may become stronger. These contractions are sometimes difficult to tell apart from true labor because they can be very uncomfortable. You should not feel embarrassed if you go to the hospital with false labor. °Sometimes, the only way to tell if you are in true labor is for your health care provider to look for changes in the cervix. The health care provider will do a physical exam and may monitor your contractions. If you are not in true labor, the exam should show that your cervix is not dilating and your water has not broken. °If there are no other health problems associated with your pregnancy, it is completely safe for you to be sent home with false labor. You may continue to have Braxton Hicks contractions until you go into true labor. °How to tell the difference between true labor and false labor °True labor °· Contractions last 30-70 seconds. °· Contractions become very regular. °· Discomfort is usually felt in the top of the uterus, and it spreads to the lower abdomen and low back. °· Contractions do not go away with walking. °· Contractions usually become more intense and increase in frequency. °· The cervix dilates and gets thinner. °False labor °· Contractions are usually shorter and not as strong as true labor contractions. °· Contractions are usually irregular. °· Contractions  are often felt in the front of the lower abdomen and in the groin. °· Contractions may go away when you walk around or change positions while lying down. °· Contractions get weaker and are shorter-lasting as time goes on. °· The cervix usually does not dilate or become thin. °Follow these instructions at home: ° °· Take over-the-counter and prescription medicines only as told by your health care provider. °· Keep up with your usual exercises and follow other instructions from your health care provider. °· Eat and drink lightly if you think you are going into labor. °· If Braxton Hicks contractions are making you uncomfortable: °? Change your position from lying down or resting to walking, or change from walking to resting. °? Sit and rest in a tub of warm water. °? Drink enough fluid to keep your urine pale yellow. Dehydration may cause these contractions. °? Do slow and deep breathing several times an hour. °· Keep all follow-up prenatal visits as told by your health care provider. This is important. °Contact a health care provider if: °· You have a fever. °· You have continuous pain in your abdomen. °Get help right away if: °· Your contractions become stronger, more regular, and closer together. °· You have fluid leaking or gushing from your vagina. °· You pass blood-tinged mucus (bloody show). °· You have bleeding from your vagina. °· You have low back pain that you never had before. °· You feel your baby’s head pushing down and causing pelvic pressure. °· Your baby is not moving inside you as much as it used to. °Summary °· Contractions that occur before labor are   called Braxton Hicks contractions, false labor, or practice contractions. °· Braxton Hicks contractions are usually shorter, weaker, farther apart, and less regular than true labor contractions. True labor contractions usually become progressively stronger and regular, and they become more frequent. °· Manage discomfort from Braxton Hicks contractions  by changing position, resting in a warm bath, drinking plenty of water, or practicing deep breathing. °This information is not intended to replace advice given to you by your health care provider. Make sure you discuss any questions you have with your health care provider. °Document Revised: 04/17/2017 Document Reviewed: 09/18/2016 °Elsevier Patient Education © 2020 Else[employer] Inc. ° °

## 2020-01-04 NOTE — Progress Notes (Signed)
   LOW-RISK PREGNANCY VISIT Patient name: Theresa David MRN [id number]  Date of birth: 1995-12-28 Chief Complaint:   Routine Prenatal Visit  History of Present Illness:   Theresa David is a 24 y.o. G76P1001 female at [redacted]w[redacted]d with an Estimated Date of Delivery: 01/27/20 being seen today for ongoing management of a low-risk pregnancy.   Today she reports some BLE edema, otherwise doing well. Contractions: Not present. Vag. Bleeding: None.  Movement: Present. denies leaking of fluid. Review of Systems:   Pertinent items are noted in HPI Denies abnormal vaginal discharge w/ itching/odor/irritation, headaches, visual changes, shortness of breath, chest pain, abdominal pain, severe nausea/vomiting, or problems with urination or bowel movements unless otherwise stated above. Pertinent History Reviewed:  Reviewed past medical,surgical, social, obstetrical and family history.  Reviewed problem list, medications and allergies. Physical Assessment:   Vitals:   01/04/20 0853  BP: 117/79  Pulse: 83  There is no height or weight on file to calculate BMI.        Physical Examination:   General appearance: Well appearing, and in no distress  Mental status: Alert, oriented to person, place, and time  Skin: Warm & dry  Cardiovascular: Normal heart rate noted  Respiratory: Normal respiratory effort, no distress  Abdomen: Soft, gravid, nontender  Pelvic: Cervical exam deferred         Extremities: Edema: Trace  Fetal Status: Fetal Heart Rate (bpm): 142 Fundal Height: 34 cm Movement: Present    Results for orders placed or performed in visit on 01/04/20 (from the past 24 hour(s))  POC Urinalysis Dipstick OB   Collection Time: 01/04/20  8:56 AM  Result Value Ref Range   Color, UA     Clarity, UA     Glucose, UA Negative Negative   Bilirubin, UA     Ketones, UA neg    Spec Grav, UA     Blood, UA neg    pH, UA     POC,PROTEIN,UA Negative Negative, Trace, Small (1+), Moderate (2+), Large (3+), 4+     Urobilinogen, UA     Nitrite, UA neg    Leukocytes, UA Negative Negative   Appearance     Odor      Assessment & Plan:  1) Low-risk pregnancy G2P1001 at [redacted]w[redacted]d with an Estimated Date of Delivery: 01/27/20   2) S<D with 11th% growth, f/u EFW next week   Meds: No orders of the defined types were placed in this encounter.  Labs/procedures today: GBS/GC/chlam collected  Plan:  Continue routine obstetrical care   Reviewed: Term labor symptoms and general obstetric precautions including but not limited to vaginal bleeding, contractions, leaking of fluid and fetal movement were reviewed in detail with the patient.  All questions were answered. Has home bp cuff. Check bp weekly, let us know if >140/90.   Follow-up: Return in about 1 week (around 01/11/2020) for LROB, [place]: EFW, in person (keep u/s on 8/25- make sure there is a visit with it).  Orders Placed This Encounter  Procedures  . Culture, beta strep (group b only)  . POC Urinalysis Dipstick OB   Arabella Merles [facility] 01/04/2020 9:13 AM

## 2020-01-05 LAB — CERVICOVAGINAL ANCILLARY ONLY
Chlamydia: NEGATIVE
Comment: NEGATIVE
Comment: NORMAL
Neisseria Gonorrhea: NEGATIVE

## 2020-01-09 LAB — CULTURE, BETA STREP (GROUP B ONLY): Strep Gp B Culture: NEGATIVE

## 2020-01-10 ENCOUNTER — Other Ambulatory Visit: Payer: Self-pay | Admitting: Advanced Practice Midwife

## 2020-01-10 DIAGNOSIS — O26843 Uterine size-date discrepancy, third trimester: Secondary | ICD-10-CM

## 2020-01-10 NOTE — Progress Notes (Addendum)
PATIENT ID: Theresa David, female     DOB: 21-Sep-1995, 24 y.o.     MRN: [id number]    LOW-RISK PREGNANCY VISIT PATIENT NAME: Theresa David MRN [id number]  DOB: [birth year]  Chief Complaint:   No chief complaint on file.   History of Present Illness:   Theresa David is a 24 y.o. G67P1001 female at [redacted]w[redacted]d with an Estimated Date of Delivery: 01/27/20 being seen today for ongoing management of a low-risk pregnancy.  Patient has a history of cholestasis of pregnancy with prior pregnancy, not currently affected this pregnancy  Depression screen [facility] 2/9 10/24/2019 07/18/2019 12/22/2018 03/11/2017 01/18/2013  Decreased Interest 0 0 0 0 0  Down, Depressed, Hopeless 0 0 0 0 0  PHQ - 2 Score 0 0 0 0 0  Altered sleeping 0 0 - 0 2  Tired, decreased energy 1 0 - 1 1  Change in appetite 0 0 - - 0  Feeling bad or failure about yourself  0 0 - 0 1  Trouble concentrating 0 0 - 0 1  Moving slowly or fidgety/restless 0 0 - 0 2  Suicidal thoughts 0 0 - 0 0  PHQ-9 Score 1 0 - 1 7  Difficult doing work/chores Not difficult at all - - Not difficult at all -    Today she reports no contractions, no leaking and Was cramping slightly over the weekend, "nothing crazy".  .  .   . denies leaking of fluid.  Review of Systems:   Pertinent items are noted in HPI Denies abnormal vaginal discharge w/ itching/odor/irritation, headaches, visual changes, shortness of breath, chest pain, abdominal pain, severe nausea/vomiting, or problems with urination or bowel movements unless otherwise stated above.  Pertinent History Reviewed:  Reviewed past medical,surgical, social, obstetrical and family history.  Reviewed problem list, medications and allergies.  Physical Assessment:  There were no vitals filed for this visit.There is no height or weight on file to calculate BMI.        Physical Examination:   General appearance: Well appearing, and in no distress  Mental status: Alert, oriented to person, place, and time  Skin:  Warm & dry  Cardiovascular: Normal heart rate noted  Respiratory: Normal respiratory effort, no distress  Abdomen: Soft, gravid, nontender   Baby: 138 bpm   35 cm  Pelvic: Cervical exam deferred         Extremities:    Fetal Status:         TECHNICIAN COMMENTS:  [place] 37+5 wks,cephalic,anterior placenta gr 3,afi 12.8 cm,fhr 138 bpm,EFW 2738 g 14.9%,BPD 9%,HC 2.2%,AC 8.6%,FL 24%  copy of this report including all images has been saved and backed up to a second source for retrieval if needed. All measures and details of the anatomical scan, placentation, fluid volume and pelvic anatomy are contained in that report.  Amber Flora Lipps 01/11/2020 9:13 AM  Chaperone: n/a    No results found for this or any previous visit (from the past 24 hour(s)).  Assessment & Plan:  1) Low-risk pregnancy G2P1001 at [redacted]w[redacted]d with an Estimated Date of Delivery: 01/27/20   2) borderline SGA infant with AC 8.6 percentile, total EFW 14.9%, will plan induction at 39 [place] and obtain twice-weekly NSTs next week.  Will offer Foley bulb placement next Thursday the day before induction and schedule induction at midnight after the Foley bulb was placed   Meds: No orders of the defined types were placed in this encounter.  Labs/procedures today: Urinalysis  Plan:  Continue routine  obstetrical care begin twice weekly NSTs next week with Foley bulb to be placed next Thursday on induction midnight on Friday, September 3 Next visit: prefers in person    Reviewed: Term labor symptoms and general obstetric precautions including but not limited to vaginal bleeding, contractions, leaking of fluid and fetal movement were reviewed in detail with the patient.  All questions were answered.  Does not have home bp cuff. . Follow-up: We will schedule NSTs twice next week, admission orders placed and Hx pended.   By signing my name below, I, [employer], attest that this documentation has been prepared under the direction and in the  presence of Tilda Burrow, MD. Electronically Signed: Mal Misty Medical Scribe. 01/10/20. 11:37 PM.  I personally performed the services described in this documentation, which was SCRIBED in my presence. The recorded information has been reviewed and considered accurate. It has been edited as necessary during review. Tilda Burrow, MD

## 2020-01-11 ENCOUNTER — Ambulatory Visit (INDEPENDENT_AMBULATORY_CARE_PROVIDER_SITE_OTHER): Payer: Medicaid Other | Admitting: Obstetrics and Gynecology

## 2020-01-11 ENCOUNTER — Other Ambulatory Visit: Payer: Self-pay | Admitting: Obstetrics and Gynecology

## 2020-01-11 ENCOUNTER — Ambulatory Visit (INDEPENDENT_AMBULATORY_CARE_PROVIDER_SITE_OTHER): Payer: Medicaid Other

## 2020-01-11 ENCOUNTER — Other Ambulatory Visit: Payer: Self-pay | Admitting: Advanced Practice Midwife

## 2020-01-11 VITALS — BP 118/80 | HR 79 | Wt 180.0 lb

## 2020-01-11 DIAGNOSIS — Z1389 Encounter for screening for other disorder: Secondary | ICD-10-CM

## 2020-01-11 DIAGNOSIS — Z3A37 37 weeks gestation of pregnancy: Secondary | ICD-10-CM

## 2020-01-11 DIAGNOSIS — O26843 Uterine size-date discrepancy, third trimester: Secondary | ICD-10-CM | POA: Diagnosis not present

## 2020-01-11 DIAGNOSIS — Z3480 Encounter for supervision of other normal pregnancy, unspecified trimester: Secondary | ICD-10-CM

## 2020-01-11 DIAGNOSIS — Z3403 Encounter for supervision of normal first pregnancy, third trimester: Secondary | ICD-10-CM

## 2020-01-11 DIAGNOSIS — Z331 Pregnant state, incidental: Secondary | ICD-10-CM | POA: Diagnosis not present

## 2020-01-11 LAB — POCT URINALYSIS DIPSTICK OB
Blood, UA: NEGATIVE
Glucose, UA: NEGATIVE
Ketones, UA: NEGATIVE
Leukocytes, UA: NEGATIVE
Nitrite, UA: NEGATIVE
POC,PROTEIN,UA: NEGATIVE

## 2020-01-11 NOTE — Patient Instructions (Signed)
[facility] at [facility] [address] [place],  [place]  [address] Get Driving Directions Main: [phone] 

## 2020-01-11 NOTE — Progress Notes (Signed)
[place] 37+5 wks,cephalic,anterior placenta gr 3,afi 12.8 cm,fhr 138 bpm,EFW 2738 g 14.9%,BPD 9%,HC 2.2%,AC 8.6%,FL 24%

## 2020-01-11 NOTE — Progress Notes (Signed)
Navada Osterhout is a 24 y.o. female G2P1001 at 39.0  wk on 01/20/2020 presenting for IOL for SGA infant, at 8.6% by [facility] and 14 % by EFW at 36 wk. . Prior OB hx notable for IOL at 37 wk for Cholestasis of pregnancy however this pregnancy has been without itching. OB History    Gravida  2   Para  1   Term  1   Preterm  0   AB  0   Living  1     SAB  0   TAB  0   Ectopic  0   Multiple  0   Live Births  1          Past Medical History:  Diagnosis Date  . Asthma   . Bacterial vaginosis   . Eczema 09/06/2012  . Pain with urination 04/26/2015  . Seasonal allergies 09/06/2012  . Unspecified asthma(493.90) 09/06/2012  . Urinary frequency 08/01/2013  . UTI (lower urinary tract infection) 08/01/2013   Past Surgical History:  Procedure Laterality Date  . NO PAST SURGERIES     Family History: family history includes Cancer (age of onset: 52) in her maternal aunt; Dementia in her maternal grandmother. Social History:  reports that she has never smoked. She has never used smokeless tobacco. She reports that she does not drink alcohol and does not use drugs.     Maternal Diabetes: No Genetic Screening: Normal Maternal Ultrasounds/Referrals: IUGR barely, only 8.6 % by [facility] Fetal Ultrasounds or other Referrals:  None Maternal Substance Abuse:  No Significant Maternal Medications:  Meds include: Other: albuterol Significant Maternal Lab Results:  Group B Strep negative Other Comments:  None  Review of Systems History   Last menstrual period 04/22/2019. Exam Physical Exam Constitutional:      Appearance: Normal appearance.  HENT:     Head: Normocephalic.     Mouth/Throat:     Mouth: Mucous membranes are moist.  Eyes:     Pupils: Pupils are equal, round, and reactive to light.  Cardiovascular:     Rate and Rhythm: Normal rate.  Pulmonary:     Effort: Pulmonary effort is normal.  Abdominal:     Comments: Gravid uterus c/w dates   Musculoskeletal:     Cervical back:  Normal range of motion.  Neurological:     Mental Status: She is alert.    pelvic exam:  Physical Examination: Pelvic - to be done at office visit 38.6  Prenatal labs: ABO, Rh: B/Positive/-- (03/01 1158) Antibody: Negative (06/07 0835) Rubella: 4.62 (03/01 1158) RPR: Non Reactive (06/07 0835)  HBsAg: Negative (03/01 1158)  HIV: Non Reactive (06/07 0835)  GBS: Negative/-- (08/18 1120)   Assessment/Plan: Pregnancy 39/.[redacted] week gestation IOL for borderline FGR with hx IOL for Cholestasis of pregnancy 37wk none this pregnancy Will admit at [facility] on 01/20/2020, with FB in place from office , IOL pitocin.   Tilda Burrow 01/11/2020, 9:16 PM

## 2020-01-15 ENCOUNTER — Other Ambulatory Visit: Payer: Self-pay | Admitting: Advanced Practice Midwife

## 2020-01-16 ENCOUNTER — Ambulatory Visit (INDEPENDENT_AMBULATORY_CARE_PROVIDER_SITE_OTHER): Payer: Medicaid Other | Admitting: Obstetrics and Gynecology

## 2020-01-16 VITALS — BP 122/73 | HR 88 | Wt 183.2 lb

## 2020-01-16 DIAGNOSIS — Z3403 Encounter for supervision of normal first pregnancy, third trimester: Secondary | ICD-10-CM

## 2020-01-16 DIAGNOSIS — Z3A38 38 weeks gestation of pregnancy: Secondary | ICD-10-CM

## 2020-01-16 DIAGNOSIS — Z1389 Encounter for screening for other disorder: Secondary | ICD-10-CM | POA: Diagnosis not present

## 2020-01-16 DIAGNOSIS — Z331 Pregnant state, incidental: Secondary | ICD-10-CM

## 2020-01-16 LAB — POCT URINALYSIS DIPSTICK OB
Blood, UA: NEGATIVE
Glucose, UA: NEGATIVE
Ketones, UA: NEGATIVE
Leukocytes, UA: NEGATIVE
Nitrite, UA: NEGATIVE
POC,PROTEIN,UA: NEGATIVE

## 2020-01-17 ENCOUNTER — Telehealth (HOSPITAL_COMMUNITY): Payer: Self-pay | Admitting: *Deleted

## 2020-01-17 NOTE — Telephone Encounter (Signed)
Preadmission screen  

## 2020-01-18 ENCOUNTER — Other Ambulatory Visit (HOSPITAL_COMMUNITY): Payer: Medicaid Other

## 2020-01-19 ENCOUNTER — Other Ambulatory Visit (HOSPITAL_COMMUNITY): Payer: Self-pay | Admitting: Advanced Practice Midwife

## 2020-01-19 ENCOUNTER — Ambulatory Visit (INDEPENDENT_AMBULATORY_CARE_PROVIDER_SITE_OTHER): Payer: Medicaid Other | Admitting: Advanced Practice Midwife

## 2020-01-19 ENCOUNTER — Encounter (HOSPITAL_COMMUNITY): Payer: Self-pay | Admitting: Advanced Practice Midwife

## 2020-01-19 VITALS — BP 128/80 | HR 81 | Wt 183.0 lb

## 2020-01-19 DIAGNOSIS — Z3403 Encounter for supervision of normal first pregnancy, third trimester: Secondary | ICD-10-CM

## 2020-01-19 DIAGNOSIS — Z331 Pregnant state, incidental: Secondary | ICD-10-CM | POA: Diagnosis not present

## 2020-01-19 DIAGNOSIS — Z1389 Encounter for screening for other disorder: Secondary | ICD-10-CM | POA: Diagnosis not present

## 2020-01-19 DIAGNOSIS — O0993 Supervision of high risk pregnancy, unspecified, third trimester: Secondary | ICD-10-CM

## 2020-01-19 DIAGNOSIS — Z3A38 38 weeks gestation of pregnancy: Secondary | ICD-10-CM

## 2020-01-19 LAB — POCT URINALYSIS DIPSTICK OB
Blood, UA: NEGATIVE
Glucose, UA: NEGATIVE
Ketones, UA: NEGATIVE
Leukocytes, UA: NEGATIVE
Nitrite, UA: NEGATIVE
POC,PROTEIN,UA: NEGATIVE

## 2020-01-19 NOTE — Progress Notes (Signed)
   LOW-RISK PREGNANCY VISIT Patient name: Theresa David MRN [id number]  Date of birth: 10-Sep-1995 Chief Complaint:   Routine Prenatal Visit (NST)  History of Present Illness:   Theresa David is a 24 y.o. G5P1001 female at [redacted]w[redacted]d with an Estimated Date of Delivery: 01/27/20 being seen today for ongoing management of a low-risk pregnancy.  Today she reports no complaints. Contractions: Irregular. Vag. Bleeding: None.  Movement: Present. denies leaking of fluid. Review of Systems:   Pertinent items are noted in HPI Denies abnormal vaginal discharge w/ itching/odor/irritation, headaches, visual changes, shortness of breath, chest pain, abdominal pain, severe nausea/vomiting, or problems with urination or bowel movements unless otherwise stated above. Pertinent History Reviewed:  Reviewed past medical,surgical, social, obstetrical and family history.  Reviewed problem list, medications and allergies. Physical Assessment:   Vitals:   01/19/20 1545  BP: 128/80  Pulse: 81  Weight: 183 lb (83 kg)  Body mass index is 30.45 kg/m.        Physical Examination:   General appearance: Well appearing, and in no distress  Mental status: Alert, oriented to person, place, and time  Skin: Warm & dry  Cardiovascular: Normal heart rate noted  Respiratory: Normal respiratory effort, no distress  Abdomen: Soft, gravid, nontender  Pelvic: Cervical exam performed  Dilation: 4 Effacement (%): 70 Station: -2  Extremities: Edema: Trace  Fetal Status: Fetal Heart Rate (bpm): 140   Movement: Present Presentation: Vertex NST: FHR baseline 140 bpm, Variability: moderate, Accelerations:present, Decelerations:  Absent= Cat 1/Reactive.   Ctx q 2-4 minutes on EFM "cramping" Chaperone: Stoney Bang    Results for orders placed or performed in visit on 01/19/20 (from the past 24 hour(s))  POC Urinalysis Dipstick OB   Collection Time: 01/19/20  3:45 PM  Result Value Ref Range   Color, UA     Clarity, UA      Glucose, UA Negative Negative   Bilirubin, UA     Ketones, UA n    Spec Grav, UA     Blood, UA n    pH, UA     POC,PROTEIN,UA Negative Negative, Trace, Small (1+), Moderate (2+), Large (3+), 4+   Urobilinogen, UA     Nitrite, UA n    Leukocytes, UA Negative Negative   Appearance     Odor      Assessment & Plan:  1) Low-risk pregnancy G2P1001 at [redacted]w[redacted]d with an Estimated Date of Delivery: 01/27/20   2) borderline SGA, IOL [facility] tonight   Meds: No orders of the defined types were placed in this encounter.  Labs/procedures today: was scheduled for outpt foley, but deferred d/t advanced cx dilation. Membranes swept  Plan:  To L&D at [facility] or earlier if laboring    Reviewed: Term labor symptoms and general obstetric precautions including but not limited to vaginal bleeding, contractions, leaking of fluid and fetal movement were reviewed in detail with the patient.  All questions were answered.    Follow-up: Return in about 6 [place] (around 03/01/2020) for postpartum.  Orders Placed This Encounter  Procedures  . POC Urinalysis Dipstick OB   Jacklyn Shell DNP, CNM 01/19/2020 4:33 PM

## 2020-01-19 NOTE — Patient Instructions (Addendum)

## 2020-01-19 NOTE — H&P (Deleted)
  The note originally documented on this encounter has been moved the the encounter in which it belongs.  

## 2020-01-19 NOTE — H&P (Signed)
Theresa David is a 24 y.o. female G2P1001 with IUP at [redacted]w[redacted]d presenting for IOL for borderline SGA. [facility]  Prenatal History/Complications: Hx ICP EFW 14%/AC 8.6%    Past Medical History: Past Medical History:  Diagnosis Date   Asthma    Bacterial vaginosis    Eczema 09/06/2012   Pain with urination 04/26/2015   Seasonal allergies 09/06/2012   Unspecified asthma(493.90) 09/06/2012   Urinary frequency 08/01/2013   UTI (lower urinary tract infection) 08/01/2013    Past Surgical History: Past Surgical History:  Procedure Laterality Date   NO PAST SURGERIES      Obstetrical History: OB History     Gravida  2   Para  1   Term  1   Preterm  0   AB  0   Living  1      SAB  0   TAB  0   Ectopic  0   Multiple  0   Live Births  1           Social History: Social History   Socioeconomic History   Marital status: Single    Spouse name: Not on file   Number of children: 1   Years of education: Not on file   Highest education level: Not on file  Occupational History   Not on file  Tobacco Use   Smoking status: Never Smoker   Smokeless tobacco: Never Used  Vaping Use   Vaping Use: Never used  Substance and Sexual Activity   Alcohol use: No   Drug use: No   Sexual activity: Yes    Birth control/protection: None  Other Topics Concern   Not on file  Social History Narrative   Attends college. Wants to be pediatric nurse   Lives with mom    No  smokers   Social Determinants of Health   Financial Resource Strain: Low Risk    Difficulty of Paying Living Expenses: Not very hard  Food Insecurity: No Food Insecurity   Worried About Running Out of Food in the Last Year: Never true   Ran Out of Food in the Last Year: Never true  Transportation Needs: No Transportation Needs   Lack of Transportation (Medical): No   Lack of Transportation (Non-Medical): No  Physical Activity: Insufficiently Active   Days of Exercise per Week: 3 days    Minutes of Exercise per Session: 30 min  Stress: No Stress Concern Present   Feeling of Stress : Not at all  Social Connections: Moderately Integrated   Frequency of Communication with Friends and Family: Three times a week   Frequency of Social Gatherings with Friends and Family: Twice a week   Attends Religious Services: More than 4 times per year   Active Member of [employer] or Organizations: No   Attends Banker Meetings: Never   Marital Status: Living with partner    Family History: Family History  Problem Relation Age of Onset   Cancer Maternal Aunt 44       breast    Dementia Maternal Grandmother     Allergies: Allergies  Allergen Reactions   Shellfish Allergy Anaphylaxis and Rash    SEAFOOD    (Not in a hospital admission)       Review of Systems   Constitutional: Negative for fever and chills Eyes: Negative for visual disturbances Respiratory: Negative for shortness of breath, dyspnea Cardiovascular: Negative for chest pain or palpitations  Gastrointestinal: Negative for abdominal pain, vomiting,  diarrhea and constipation.   Genitourinary: Negative for dysuria and urgency Musculoskeletal: Negative for back pain, joint pain, myalgias  Neurological: Negative for dizziness and headaches      Last menstrual period 04/22/2019. General appearance: alert, cooperative, appears stated age and no distress Lungs: normal respiratory effort Heart: regular rate and rhythm Abdomen: soft, non-tender; bowel sounds normal Extremities: Homans sign is negative, no sign of DVT DTR's 2+ Presentation: cephalic Fetal monitoring  Baseline: 140 bpm, Variability: Good {> 6 bpm), Accelerations: Reactive and Decelerations: Absent Uterine activity  2-5 minutes Cx 5/90/-2/vtx        FAMILY TREE  LAB RESULTS  Language English Pap 12/22/18 neg  Initiated care at 12wk GC/CT Initial:  -/-          36wks:-/-  Dating by LMP c/w 8wk U/S    Support person  Genetics NT/IT:  neg    AFP:      MaterniT21: normal female    [place]/HgbE 03/11/17 neg  Flu vaccine 07/18/19 CF 03/11/17 neg  TDaP vaccine 6/7 SMA neg  Rhogam n/a Fragile X neg       Anatomy US Normal female 'Adonis' Blood Type B/Positive/-- (03/01 1158)  Feeding Plan breast Antibody Negative (03/01 1158)  Contraception pp [facility] HBsAg Negative (03/01 1158)  Circumcision @ [facility] RPR Non Reactive (03/01 1158)  Pediatrician [facility] Rubella  4.62 (03/01 1158)  Prenatal Classes discussed HIV Non Reactive (03/01 1158)    HepC neg    GTT/A1C Early:      26-28wks: normal  BTL Consent  GBS     neg   [ ]  PCN allergy  VBAC Consent     Waterbirth [ ] Class [ ] Consent [ ] CNM visit PP Needs        Prenatal Transfer Tool  Maternal Diabetes: No Genetic Screening: Normal Maternal Ultrasounds/Referrals: Other: borderline SGA Fetal Ultrasounds or other Referrals:  As above Maternal Substance Abuse:  No Significant Maternal Medications:  None Significant Maternal Lab Results: Group B Strep negative    Results for orders placed or performed during the hospital encounter of 01/20/20 (from the past 24 hour(s))  Type and screen   Collection Time: 01/20/20  1:04 AM  Result Value Ref Range   ABO/RH(D) PENDING    Antibody Screen PENDING    Sample Expiration      01/23/2020,2359 Performed at [facility], [address] [place], 03/24/2020 [facility]   Results for orders placed or performed in visit on 01/19/20 (from the past 24 hour(s))  POC Urinalysis Dipstick OB   Collection Time: 01/19/20  3:45 PM  Result Value Ref Range   Color, UA     Clarity, UA     Glucose, UA Negative Negative   Bilirubin, UA     Ketones, UA n    Spec Grav, UA     Blood, UA n    pH, UA     POC,PROTEIN,UA Negative Negative, Trace, Small (1+), Moderate (2+), Large (3+), 4+   Urobilinogen, UA     Nitrite, UA n    Leukocytes, UA Negative Negative   Appearance     Odor      Assessment: Theresa David is a 24 y.o. G2P1001 with  an IUP at 30 presenting for IOL for borderline SGA.  Plan: #Labor: pitocin #Pain:  Per request #FWB Cat 1 #ID: GBS: neg  #MOF:  breast #MOC: pp [facility] #[facility] @ [facility]   Billey Gosling 01/20/2020, 1:27 AM

## 2020-01-20 ENCOUNTER — Inpatient Hospital Stay (HOSPITAL_COMMUNITY): Payer: Medicaid Other

## 2020-01-20 ENCOUNTER — Other Ambulatory Visit: Payer: Self-pay

## 2020-01-20 ENCOUNTER — Inpatient Hospital Stay (HOSPITAL_COMMUNITY)
Admission: AD | Admit: 2020-01-20 | Discharge: 2020-01-21 | DRG: 807 | Disposition: A | Discharge status: Home / Self Care | Payer: Medicaid Other | Attending: Obstetrics and Gynecology | Admitting: Obstetrics and Gynecology

## 2020-01-20 ENCOUNTER — Encounter (HOSPITAL_COMMUNITY): Payer: Self-pay | Admitting: Obstetrics and Gynecology

## 2020-01-20 DIAGNOSIS — Z3A38 38 weeks gestation of pregnancy: Secondary | ICD-10-CM | POA: Diagnosis not present

## 2020-01-20 DIAGNOSIS — O365931 Maternal care for other known or suspected poor fetal growth, third trimester, fetus 1: Secondary | ICD-10-CM | POA: Diagnosis present

## 2020-01-20 DIAGNOSIS — Z30014 Encounter for initial prescription of intrauterine contraceptive device: Secondary | ICD-10-CM | POA: Diagnosis not present

## 2020-01-20 DIAGNOSIS — O365930 Maternal care for other known or suspected poor fetal growth, third trimester, not applicable or unspecified: Secondary | ICD-10-CM | POA: Diagnosis not present

## 2020-01-20 DIAGNOSIS — Z30430 Encounter for insertion of intrauterine contraceptive device: Secondary | ICD-10-CM | POA: Diagnosis not present

## 2020-01-20 DIAGNOSIS — Z20822 Contact with and (suspected) exposure to covid-19: Secondary | ICD-10-CM | POA: Diagnosis present

## 2020-01-20 DIAGNOSIS — Z3A39 39 weeks gestation of pregnancy: Secondary | ICD-10-CM | POA: Diagnosis not present

## 2020-01-20 LAB — CBC
HCT: 34.3 % — ABNORMAL LOW (ref 36.0–46.0)
Hemoglobin: 10.7 g/dL — ABNORMAL LOW (ref 12.0–15.0)
MCH: 29.9 pg (ref 26.0–34.0)
MCHC: 31.2 g/dL (ref 30.0–36.0)
MCV: 95.8 fL (ref 80.0–100.0)
Platelets: 288 10*3/uL (ref 150–400)
RBC: 3.58 MIL/uL — ABNORMAL LOW (ref 3.87–5.11)
RDW: 13.3 % (ref 11.5–15.5)
WBC: 9.3 10*3/uL (ref 4.0–10.5)
nRBC: 0 % (ref 0.0–0.2)

## 2020-01-20 LAB — TYPE AND SCREEN
ABO/RH(D): B POS
Antibody Screen: NEGATIVE

## 2020-01-20 LAB — RPR: RPR Ser Ql: NONREACTIVE

## 2020-01-20 LAB — SARS CORONAVIRUS 2 BY RT PCR (HOSPITAL ORDER, PERFORMED IN ~~LOC~~ HOSPITAL LAB): SARS Coronavirus 2: NEGATIVE

## 2020-01-20 MED ORDER — OXYCODONE-ACETAMINOPHEN 5-325 MG PO TABS: 2.0000 | ORAL_TABLET | ORAL | Status: DC | PRN

## 2020-01-20 MED ORDER — WITCH HAZEL-GLYCERIN EX PADS: 1.0000 "application " | MEDICATED_PAD | CUTANEOUS | Status: DC | PRN

## 2020-01-20 MED ORDER — FLEET ENEMA 7-19 GM/118ML RE ENEM: 1.0000 | ENEMA | Freq: Every day | RECTAL | Status: DC | PRN

## 2020-01-20 MED ORDER — LACTATED RINGERS IV SOLN: 500.0000 mL | INTRAVENOUS | Status: DC | PRN

## 2020-01-20 MED ORDER — OXYTOCIN-SODIUM CHLORIDE 30-0.9 UT/500ML-% IV SOLN
2.5000 [IU]/h | INTRAVENOUS | Status: DC
  Administered 2020-01-20: 2.5 [IU]/h via INTRAVENOUS

## 2020-01-20 MED ORDER — TERBUTALINE SULFATE 1 MG/ML IJ SOLN: 0.2500 mg | Freq: Once | INTRAMUSCULAR | Status: DC | PRN

## 2020-01-20 MED ORDER — LEVONORGESTREL 19.5 MCG/DAY IU IUD
INTRAUTERINE_SYSTEM | INTRAUTERINE | Status: AC
  Filled 2020-01-20: qty 1

## 2020-01-20 MED ORDER — LIDOCAINE HCL (PF) 1 % IJ SOLN
30.0000 mL | INTRAMUSCULAR | Status: AC | PRN
  Administered 2020-01-20: 30 mL via SUBCUTANEOUS
  Filled 2020-01-20: qty 30

## 2020-01-20 MED ORDER — DOCUSATE SODIUM 100 MG PO CAPS
100.0000 mg | ORAL_CAPSULE | Freq: Two times a day (BID) | ORAL | Status: DC
  Administered 2020-01-20 – 2020-01-21 (×2): 100 mg via ORAL
  Filled 2020-01-20 (×2): qty 1

## 2020-01-20 MED ORDER — OXYTOCIN BOLUS FROM INFUSION
333.0000 mL | Freq: Once | INTRAVENOUS | Status: AC
  Administered 2020-01-20: 333 mL via INTRAVENOUS

## 2020-01-20 MED ORDER — ACETAMINOPHEN 325 MG PO TABS: 650.0000 mg | ORAL_TABLET | ORAL | Status: DC | PRN

## 2020-01-20 MED ORDER — FLEET ENEMA 7-19 GM/118ML RE ENEM: 1.0000 | ENEMA | RECTAL | Status: DC | PRN

## 2020-01-20 MED ORDER — ONDANSETRON HCL 4 MG PO TABS: 4.0000 mg | ORAL_TABLET | ORAL | Status: DC | PRN

## 2020-01-20 MED ORDER — SIMETHICONE 80 MG PO CHEW: 80.0000 mg | CHEWABLE_TABLET | ORAL | Status: DC | PRN

## 2020-01-20 MED ORDER — BISACODYL 10 MG RE SUPP: 10.0000 mg | Freq: Every day | RECTAL | Status: DC | PRN

## 2020-01-20 MED ORDER — ONDANSETRON HCL 4 MG/2ML IJ SOLN: 4.0000 mg | Freq: Four times a day (QID) | INTRAMUSCULAR | Status: DC | PRN

## 2020-01-20 MED ORDER — LEVONORGESTREL 19.5 MCG/DAY IU IUD
INTRAUTERINE_SYSTEM | INTRAUTERINE | Status: AC
  Administered 2020-01-20: 1 via INTRAUTERINE
  Filled 2020-01-20: qty 1

## 2020-01-20 MED ORDER — ZOLPIDEM TARTRATE 5 MG PO TABS: 5.0000 mg | ORAL_TABLET | Freq: Every evening | ORAL | Status: DC | PRN

## 2020-01-20 MED ORDER — ONDANSETRON HCL 4 MG/2ML IJ SOLN: 4.0000 mg | INTRAMUSCULAR | Status: DC | PRN

## 2020-01-20 MED ORDER — DIBUCAINE (PERIANAL) 1 % EX OINT: 1.0000 "application " | TOPICAL_OINTMENT | CUTANEOUS | Status: DC | PRN

## 2020-01-20 MED ORDER — OXYTOCIN-SODIUM CHLORIDE 30-0.9 UT/500ML-% IV SOLN
1.0000 m[IU]/min | INTRAVENOUS | Status: DC
  Filled 2020-01-20: qty 500

## 2020-01-20 MED ORDER — METHYLERGONOVINE MALEATE 0.2 MG/ML IJ SOLN: 0.2000 mg | INTRAMUSCULAR | Status: DC | PRN

## 2020-01-20 MED ORDER — LEVONORGESTREL 19.5 MCG/DAY IU IUD
INTRAUTERINE_SYSTEM | Freq: Once | INTRAUTERINE | Status: AC
  Filled 2020-01-20: qty 1

## 2020-01-20 MED ORDER — MEASLES, MUMPS & RUBELLA VAC IJ SOLR: 0.5000 mL | Freq: Once | INTRAMUSCULAR | Status: DC

## 2020-01-20 MED ORDER — DIPHENHYDRAMINE HCL 25 MG PO CAPS: 25.0000 mg | ORAL_CAPSULE | Freq: Four times a day (QID) | ORAL | Status: DC | PRN

## 2020-01-20 MED ORDER — BENZOCAINE-MENTHOL 20-0.5 % EX AERO
1.0000 "application " | INHALATION_SPRAY | CUTANEOUS | Status: DC | PRN
  Administered 2020-01-20: 1 via TOPICAL
  Filled 2020-01-20: qty 56

## 2020-01-20 MED ORDER — LACTATED RINGERS IV SOLN: INTRAVENOUS | Status: DC

## 2020-01-20 MED ORDER — FENTANYL CITRATE (PF) 100 MCG/2ML IJ SOLN
50.0000 ug | INTRAMUSCULAR | Status: DC | PRN
  Administered 2020-01-20: 50 ug via INTRAVENOUS
  Filled 2020-01-20: qty 2

## 2020-01-20 MED ORDER — TETANUS-DIPHTH-ACELL PERTUSSIS 5-2.5-18.5 LF-MCG/0.5 IM SUSP: 0.5000 mL | Freq: Once | INTRAMUSCULAR | Status: DC

## 2020-01-20 MED ORDER — FERROUS SULFATE 325 (65 FE) MG PO TABS
325.0000 mg | ORAL_TABLET | Freq: Two times a day (BID) | ORAL | Status: DC
  Administered 2020-01-20 – 2020-01-21 (×4): 325 mg via ORAL
  Filled 2020-01-20 (×4): qty 1

## 2020-01-20 MED ORDER — OXYTOCIN-SODIUM CHLORIDE 30-0.9 UT/500ML-% IV SOLN
1.0000 m[IU]/min | INTRAVENOUS | Status: DC
  Administered 2020-01-20: 1 m[IU]/min via INTRAVENOUS

## 2020-01-20 MED ORDER — COCONUT OIL OIL
1.0000 "application " | TOPICAL_OIL | Status: DC | PRN
  Administered 2020-01-21: 1 via TOPICAL

## 2020-01-20 MED ORDER — ACETAMINOPHEN 325 MG PO TABS
650.0000 mg | ORAL_TABLET | ORAL | Status: DC | PRN
  Administered 2020-01-20: 650 mg via ORAL
  Filled 2020-01-20: qty 2

## 2020-01-20 MED ORDER — OXYCODONE-ACETAMINOPHEN 5-325 MG PO TABS: 1.0000 | ORAL_TABLET | ORAL | Status: DC | PRN

## 2020-01-20 MED ORDER — SOD CITRATE-CITRIC ACID 500-334 MG/5ML PO SOLN: 30.0000 mL | ORAL | Status: DC | PRN

## 2020-01-20 MED ORDER — IBUPROFEN 600 MG PO TABS
600.0000 mg | ORAL_TABLET | Freq: Four times a day (QID) | ORAL | Status: DC
  Administered 2020-01-20 – 2020-01-21 (×6): 600 mg via ORAL
  Filled 2020-01-20 (×6): qty 1

## 2020-01-20 MED ORDER — METHYLERGONOVINE MALEATE 0.2 MG PO TABS: 0.2000 mg | ORAL_TABLET | ORAL | Status: DC | PRN

## 2020-01-20 MED ORDER — PRENATAL MULTIVITAMIN CH
1.0000 | ORAL_TABLET | Freq: Every day | ORAL | Status: DC
  Administered 2020-01-20 – 2020-01-21 (×2): 1 via ORAL
  Filled 2020-01-20 (×2): qty 1

## 2020-01-20 NOTE — Discharge Summary (Signed)
Postpartum Discharge Summary  Date of Service updated     Patient Name: Theresa David DOB: 02-07-1996 MRN: [id number]  Date of admission: 01/20/2020 Delivery date:01/20/2020  Delivering provider: Christin Fudge  Date of discharge: 01/21/2020  Admitting diagnosis: SGA (small for gestational age), fetal, affecting care of mother, antepartum, third trimester, fetus 1 [O36.5931] Intrauterine pregnancy: [redacted]w[redacted]d    Secondary diagnosis:  Active Problems:   SGA (small for gestational age), fetal, affecting care of mother, antepartum, third trimester, fetus 1  Additional problems: None   Discharge diagnosis: Term Pregnancy Delivered                                              Post partum procedures:Liletta IUD inserted Augmentation: AROM and Pitocin Complications: None  Hospital course: Induction of Labor With Vaginal Delivery   24y.o. yo G2P1001 at 334w0das admitted to the hospital 01/20/2020 for induction of labor.  Indication for induction: SGA.  Patient had an uncomplicated labor course as follows: started on Pitocin, max 3 mu/min.  AROM @ 9cms and baby immediately followed. IUD inserted (see separate note) Membrane Rupture Time/Date: 3:53 AM ,01/20/2020   Delivery Method:Vaginal, Spontaneous  Episiotomy: None  Lacerations:  2nd degree  Details of delivery can be found in separate delivery note.  Patient had a routine postpartum course. Patient is discharged home 01/21/20.  Newborn Data: Birth date:01/20/2020  Birth time:3:53 AM  Gender:Female  Living status:Living  Apgars:9 ,9  Weight:2886 g   Magnesium Sulfate received: No BMZ received: No Rhophylac:N/A MMR:N/A T-DaP:Given prenatally Flu: N/A Transfusion:No  Physical exam  Vitals:   01/20/20 1430 01/20/20 1811 01/20/20 2128 01/21/20 0400  BP: 115/74 119/84 115/70 113/76  Pulse: 67 86 91 83  Resp:  '20 18 16  ' Temp: 98.3 F (36.8 C)   98.2 F (36.8 C)  TempSrc: Oral Oral  Oral  SpO2:    100%  Weight:       Height:       General: alert, cooperative and no distress Lochia: appropriate Uterine Fundus: firm Incision: N/A DVT Evaluation: No evidence of DVT seen on physical exam. Labs: Lab Results  Component Value Date   WBC 9.3 01/20/2020   HGB 10.7 (L) 01/20/2020   HCT 34.3 (L) 01/20/2020   MCV 95.8 01/20/2020   PLT 288 01/20/2020   CMP Latest Ref Rng & Units 12/07/2019  Glucose 65 - 99 mg/dL 74  BUN 6 - 20 mg/dL 9  Creatinine 0.57 - 1.00 mg/dL 0.62  Sodium 134 - 144 mmol/L 139  Potassium 3.5 - 5.2 mmol/L 5.1  Chloride 96 - 106 mmol/L 104  CO2 20 - 29 mmol/L 21  Calcium 8.7 - 10.2 mg/dL 9.3  Total Protein 6.0 - 8.5 g/dL 7.1  Total Bilirubin 0.0 - 1.2 mg/dL 0.3  Alkaline Phos 48 - 121 IU/L 145(H)  AST 0 - 40 IU/L 18  ALT 0 - 32 IU/L 12   Edinburgh Score: Edinburgh Postnatal Depression Scale Screening Tool 01/20/2020  I have been able to laugh and see the funny side of things. 0  I have looked forward with enjoyment to things. 0  I have blamed myself unnecessarily when things went wrong. 1  I have been anxious or worried for no good reason. 1  I have felt scared or panicky for no good reason. 1  Things have been  getting on top of me. 1  I have been so unhappy that I have had difficulty sleeping. 0  I have felt sad or miserable. 0  I have been so unhappy that I have been crying. 0  The thought of harming myself has occurred to me. 0  Edinburgh Postnatal Depression Scale Total 4     After visit meds:  Allergies as of 01/21/2020      Reactions   Shellfish Allergy Anaphylaxis, Rash   SEAFOOD      Medication List    TAKE these medications   prenatal vitamin w/FE, FA 27-1 MG Tabs tablet Take 1 tablet by mouth daily at 12 noon.        Discharge home in stable condition Infant Feeding: Breast Infant Disposition:home with mother Discharge instruction: per After Visit Summary and Postpartum booklet. Activity: Advance as tolerated. Pelvic rest for 6 [place].  Diet:  routine diet Future Appointments: Future Appointments  Date Time Provider [place]  03/01/2020  8:30 AM Cresenzo-Dishmon, Joaquim Lai, CNM [facility]   Follow up Visit:   Please schedule this patient for a In person postpartum visit in 4 [place] with the following provider: Any provider. Additional Postpartum F/U:  Low risk pregnancy complicated by: SGA Delivery mode:  Vaginal, Spontaneous  Anticipated Birth Control:  PP IUD placed   01/21/2020 Starr Lake, CNM

## 2020-01-20 NOTE — Plan of Care (Signed)
L&D careplan completed 

## 2020-01-21 NOTE — Lactation Note (Signed)
This note was copied from a baby's chart. Lactation Consultation Note  Patient Name: Theresa David [id number] Date: 01/21/2020 Reason for consult: Initial assessment;Term P2, 20 hour female term infant. Mom's current feeding choice is breast and donor breast milk. Per mom, infant is latching well at breast and most feeding are 10 to 15 minutes in length.  Per mom, she is active on the [facility] in [place] and she has DEBP at home.  Mom asking for BF consult due her concerns she doesn't have breast milk and she is supplementing infant with 10 mls of donor breast milk with a slow flow bottle nipple after latching infant at breast.  Per mom, she used DEBP once, LC explained importance of pumping in helping establish her milk supply, mom will  use DEBP every 3 hours for 15 minutes as previously advised by RN. Mom shown how to use DEBP & how to disassemble, clean, & reassemble parts. Mom will continue to BF infant according to hunger cues, 8 to 12 times within 24 hours and mom will latch infant at breast for every feeding first before offering donor breast milk. LC did not observe latch at this time, mom was holding sleep infant in her arms but not STS. LC discussed hand expression and mom expressed in bullet, she was pleased to see that she has breast milk, mom will offer finger feed infant her EBM after she latch infant at breast for the next feeding. Mom knows to call RN or LC if she has any further questions, concerns or need assistance with latching infant at breast. Mom made aware of O/P services, breastfeeding support groups, community resources, and our phone # for post-discharge questions.   Maternal Data Formula Feeding for Exclusion: No Has patient been taught Hand Expression?: Yes Does the patient have breastfeeding experience prior to this delivery?: Yes  Feeding Feeding Type: Breast Fed  LATCH Score                   Interventions Interventions: Breast  feeding basics reviewed;Skin to skin;Hand express;Expressed milk;DEBP  Lactation Tools Discussed/Used WIC Program: Yes Pump Review: Setup, frequency, and cleaning;Milk Storage Initiated by:: RN Date initiated:: 01/20/20   Consult Status Consult Status: Follow-up Date: 01/21/20 Follow-up type: In-patient    Danelle Earthly 01/21/2020, 12:48 AM

## 2020-01-21 NOTE — Lactation Note (Addendum)
This note was copied from a baby's chart. Lactation Consultation Note  Patient Name: Theresa David Today's Date: 01/21/2020  Infant is 35 hours old 39 Pink with 5% weight loss and adequate output of urine and stool. Mom is P2 second time breastfeeding. She states infant is latching well and nursing both breasts for 15-30 minutes with signs of milk transfer.   Mom has been pumping using DEBP every 3 hours for at least 15 minutes. She has not been able to collect from the DEBP but has had success with hand expression and using the manual pump. Mom states she does feel let down with pumping.   Mom has everted nipples with no signs of nipple trauma. She is using lanolin on her nipples. Mom was pumping using a 24 flange that was touching the areola as the nipples were drawn into the flange. LC increased her to 35 and Mom states it felt more comfortable. Mom's breast were soft and full. LC will also provide coconut oil for nipple care since it is less irritating and easier to use with nursing.   LC reviewed with Mom feeding frequency based on cues 8-12 x in 24 hour period. Mom is aware that she should delay the use of the pacifer for at least 4 [place] to establish a good latch. LC reviewed with Mom the signs, symptoms treatment and prevention of engorgement. LC also reviewed with Mom milk storage and cleaning of pump parts.   Plan Mom will nurse based on feeding cues 8-12 in 24 hours.          Mom will use her DEBP to pump after feeding q 3 hours for 15 minutes.          Mom given [facility] brochure with a list of outpatient services.          Mom to f/u with her pediatrician after d/c on Tuesday.         Phillip Maffei  Nicholson-Springer 01/21/2020, 3:34 PM

## 2020-01-24 ENCOUNTER — Telehealth: Payer: Self-pay | Admitting: *Deleted

## 2020-01-24 NOTE — Telephone Encounter (Signed)
Emailed request to [facility] to schedule  NP appointment .   Theresa David PEC [phone]                                                                                       

## 2020-01-24 NOTE — Telephone Encounter (Signed)
Contacted patient to complete Transition of Care Assessment: Transition Care Management Follow-up Telephone Call  Date of discharge and from where: 01/21/20, [facility]  How have you been since you were released from the hospital? "I'm fine"  Any questions or concerns? No  Items Reviewed:  Did the pt receive and understand the discharge instructions provided? Yes   Medications obtained and verified? No   Any new allergies since your discharge? No   Dietary orders reviewed? Y  Do you have support at home? Y  Functional Questionnaire: (I = Independent and D = Dependent) ADLs: I  Bathing/Dressing- I  Meal Prep- I  Eating- I  Maintaining continence- I  Transferring/Ambulation- I  Managing Meds- I  Follow up appointments reviewed:   PCP Hospital f/u appt confirmed? No Patient would like to be assigned a PCP.  Specialist Hospital f/u appt confirmed? Yes  Scheduled to see Dr Mabeline Caras on 03/01/20 @1030    Are transportation arrangements needed? No   If their condition worsens, is the pt aware to call PCP or go to the Emergency Dept.? YES  Was the patient provided with contact information for the PCP's office or ED? YYES  Was to pt encouraged to call back with questions or concerns? YES  , RN, BSN, CCRN Patient Engagement [facility] [phone]

## 2020-01-26 NOTE — Telephone Encounter (Signed)
Please disregard previous encounter ,contacted patient gave her the contact number for [facility] to request a NP appointment. Red Mandt PEC  [phone]

## 2020-02-04 ENCOUNTER — Other Ambulatory Visit: Payer: Medicaid Other

## 2020-02-04 ENCOUNTER — Other Ambulatory Visit: Payer: Self-pay | Admitting: Critical Care Medicine

## 2020-02-04 DIAGNOSIS — Z20822 Contact with and (suspected) exposure to covid-19: Secondary | ICD-10-CM

## 2020-02-05 DIAGNOSIS — Z20822 Contact with and (suspected) exposure to covid-19: Secondary | ICD-10-CM | POA: Diagnosis not present

## 2020-02-07 LAB — SARS-COV-2, NAA 2 DAY TAT

## 2020-02-07 LAB — SPECIMEN STATUS REPORT

## 2020-02-07 LAB — NOVEL CORONAVIRUS, NAA: SARS-CoV-2, NAA: NOT DETECTED

## 2020-02-09 ENCOUNTER — Ambulatory Visit: Payer: Medicaid Other | Attending: Internal Medicine

## 2020-02-09 DIAGNOSIS — Z23 Encounter for immunization: Secondary | ICD-10-CM

## 2020-02-09 NOTE — Progress Notes (Signed)
   Covid-19 Vaccination Clinic  Name:  Theresa David    MRN: [id number] DOB: 03-30-1996  02/09/2020  [name] was observed post Covid-19 immunization for 15 minutes without incident. She was provided with Vaccine Information Sheet and instruction to access the V-Safe system.   [name] was instructed to call 911 with any severe reactions post vaccine: Mar[place] Kitchen Difficulty breathing  . Swelling of face and throat  . A fast heartbeat  . A bad rash all over body  . Dizziness and weakness   Immunizations Administered    Name Date Dose VIS Date Route   Moderna COVID-19 Vaccine 02/09/2020 10:12 AM 0.5 mL 04/2019 Intramuscular   Manufacturer: Moderna   Lot: [id number]   NDC: 28206-015-61

## 2020-02-14 ENCOUNTER — Other Ambulatory Visit: Payer: Medicaid Other

## 2020-02-14 ENCOUNTER — Ambulatory Visit (INDEPENDENT_AMBULATORY_CARE_PROVIDER_SITE_OTHER): Payer: Medicaid Other | Admitting: Internal Medicine

## 2020-02-14 ENCOUNTER — Other Ambulatory Visit: Payer: Self-pay

## 2020-02-14 ENCOUNTER — Encounter: Payer: Self-pay | Admitting: Internal Medicine

## 2020-02-14 VITALS — BP 130/77 | HR 71 | Temp 97.3°F | Resp 16 | Ht 65.0 in | Wt 164.8 lb

## 2020-02-14 DIAGNOSIS — Z7689 Persons encountering health services in other specified circumstances: Secondary | ICD-10-CM

## 2020-02-14 NOTE — Patient Instructions (Addendum)
Please continue to take Prenatal Vitamins and iron supplementation.  Okay to use tylenol as needed for fever, or muscle aches. Okay to use over-the-counter cold and sinus medications for brief periods. Please use nasal saline for nasal congestion.  Please call us if you have fever, chills, persistent fatigue or cough.  You are advised to perform deep breathing exercise as explained and moderate exercise as tolerated.

## 2020-02-14 NOTE — Progress Notes (Signed)
New Patient Office Visit  Subjective:  Patient ID: Theresa David, female    DOB: Mar 14, 1996  Age: 24 y.o. MRN: [id number]  CC:  Chief Complaint  Patient presents with  . New Patient (Initial Visit)    new pt cough congestion and headache for 2 days nothing helps the headache     HPI Theresa David is a 24 year old female with past medical history of mild asthma and eczema presents for establishing care.  Patient had abnormal vaginal delivery 3 Mar[place] ago, and was told to establish care with PCP.  Today, patient mentions that she has been having headache for last 2 days.  She describes it as dull, intermittent, 3-5 out of 10, nonradiating with no exacerbating or alleviating factors.  She has not tried any over-the-counter medications for it.  She mentions occasional cough, but denies any sputum, fever, chills, fatigue, muscle aches, change in smell or taste sensation.  Of note, patient had first dose of Covid vaccine 4 days ago.  She has not had flu vaccine yet.  Patient had intrahepatic cholestasis of pregnancy during her first pregnancy, but second pregnancy was uneventful.  She is currently taking prenatal vitamins and iron supplementation.  Patient had Pap test done with HPV testing in 12/2018.  Past Medical History:  Diagnosis Date  . Asthma   . Bacterial vaginosis   . Eczema 09/06/2012  . Pain with urination 04/26/2015  . Seasonal allergies 09/06/2012  . Unspecified asthma(493.90) 09/06/2012  . Urinary frequency 08/01/2013  . UTI (lower urinary tract infection) 08/01/2013    Past Surgical History:  Procedure Laterality Date  . NO PAST SURGERIES      Family History  Problem Relation Age of Onset  . Cancer Maternal Aunt 44       breast   . Dementia Maternal Grandmother     Social History   Socioeconomic History  . Marital status: Single    Spouse name: Not on file  . Number of children: 1  . Years of education: Not on file  . Highest education level: Not on file    Occupational History  . Not on file  Tobacco Use  . Smoking status: Never Smoker  . Smokeless tobacco: Never Used  Vaping Use  . Vaping Use: Never used  Substance and Sexual Activity  . Alcohol use: No  . Drug use: No  . Sexual activity: Yes    Birth control/protection: None  Other Topics Concern  . Not on file  Social History Narrative   Attends college. Wants to be pediatric nurse   Lives with mom    No  smokers   Social Determinants of Health   Financial Resource Strain: Low Risk   . Difficulty of Paying Living Expenses: Not very hard  Food Insecurity: No Food Insecurity  . Worried About Programme researcher, broadcasting/film/video in the Last Year: Never true  . Ran Out of Food in the Last Year: Never true  Transportation Needs: No Transportation Needs  . Lack of Transportation (Medical): No  . Lack of Transportation (Non-Medical): No  Physical Activity: Insufficiently Active  . Days of Exercise per Week: 3 days  . Minutes of Exercise per Session: 30 min  Stress: No Stress Concern Present  . Feeling of Stress : Not at all  Social Connections: Moderately Integrated  . Frequency of Communication with Friends and Family: Three times a week  . Frequency of Social Gatherings with Friends and Family: Twice a week  . Attends Religious Services:  More than 4 times per year  . Active Member of Clubs or Organizations: No  . Attends Banker Meetings: Never  . Marital Status: Living with partner  Intimate Partner Violence: Not At Risk  . Fear of Current or Ex-Partner: No  . Emotionally Abused: No  . Physically Abused: No  . Sexually Abused: No    ROS Review of Systems  Constitutional: Negative for chills and fever.  HENT: Negative for congestion, sinus pressure, sinus pain and sore throat.   Eyes: Negative for pain and discharge.  Respiratory: Negative for chest tightness and shortness of breath.   Cardiovascular: Negative for chest pain and palpitations.  Gastrointestinal:  Negative for abdominal pain, constipation, diarrhea, nausea and vomiting.  Endocrine: Negative for polydipsia and polyuria.  Genitourinary: Negative for dysuria and hematuria.  Musculoskeletal: Negative for neck pain and neck stiffness.  Skin: Negative for rash.  Neurological: Negative for dizziness and weakness.  Psychiatric/Behavioral: Negative for agitation and behavioral problems.    Objective:   Today's Vitals: BP 130/77 (BP Location: Right Arm, Patient Position: Sitting, Cuff Size: Normal)   Pulse 71   Temp (!) 97.3 F (36.3 C) (Temporal)   Resp 16   Ht 5\' 5"  (1.651 m)   Wt 164 lb 12.8 oz (74.8 kg)   LMP 04/22/2019   SpO2 97%   BMI 27.42 kg/m   Physical Exam Vitals reviewed.  Constitutional:      General: She is not in acute distress.    Appearance: She is not diaphoretic.  HENT:     Head: Normocephalic and atraumatic.     Nose: Nose normal.     Mouth/Throat:     Mouth: Mucous membranes are moist.  Eyes:     General: No scleral icterus.    Extraocular Movements: Extraocular movements intact.     Pupils: Pupils are equal, round, and reactive to light.  Cardiovascular:     Rate and Rhythm: Normal rate and regular rhythm.     Pulses: Normal pulses.     Heart sounds: No murmur heard.   Pulmonary:     Breath sounds: Normal breath sounds. No wheezing or rales.  Abdominal:     Palpations: Abdomen is soft.     Tenderness: There is no abdominal tenderness.  Musculoskeletal:     Cervical back: Neck supple. No tenderness.     Right lower leg: No edema.     Left lower leg: No edema.  Skin:    General: Skin is warm.     Findings: No rash.  Neurological:     General: No focal deficit present.     Mental Status: She is alert and oriented to person, place, and time.  Psychiatric:        Mood and Affect: Mood normal.        Behavior: Behavior normal.     Assessment & Plan:   Encounter to establish care Care established Previous chart reviewed History and  medications reviewed with the patient  S/p second pregnancy On multivitamin and iron supplementation  Headache No alarming signs, Tylenol PRN for headache  H/o intrahepatic cholestasis of pregnancy Will check CMP in next visit Last CMP in the chart reviewed, elevated ALP No c/o itching, no scleral icterus noted  Mild intermittent asthma Uses Albuterol inhaler PRN, has to use it only during winter season occasionally  Last PAP test with HPV testing in 12/2018.  S/p 1 dose of COVID vaccine. Advised to get second dose as scheduled. Patient prefers  to get flu vaccine after COVID vaccine.    Problem List Items Addressed This Visit    None    Visit Diagnoses    Encounter to establish care    -  Primary   Relevant Orders   CBC with Differential   Comprehensive Metabolic Panel (CMET)   Lipid Profile   Vitamin D 1,25 dihydroxy      Outpatient Encounter Medications as of 02/14/2020  Medication Sig  . prenatal vitamin w/FE, FA (PRENATAL 1 + 1) 27-1 MG TABS tablet Take 1 tablet by mouth daily at 12 noon.  . [DISCONTINUED] albuterol (PROAIR HFA) 108 (90 Base) MCG/ACT inhaler Inhale 2 puffs into the lungs every 4 (four) hours as needed for wheezing (Use with spacer). (Patient not taking: Reported on 09/26/2019)   No facility-administered encounter medications on file as of 02/14/2020.    Follow-up: Return in about 4 months (around 06/15/2020).   Anabel Halon, MD

## 2020-03-01 ENCOUNTER — Encounter: Payer: Self-pay | Admitting: Women's Health

## 2020-03-01 ENCOUNTER — Ambulatory Visit (INDEPENDENT_AMBULATORY_CARE_PROVIDER_SITE_OTHER): Payer: Medicaid Other | Admitting: Women's Health

## 2020-03-01 ENCOUNTER — Other Ambulatory Visit: Payer: Self-pay

## 2020-03-01 DIAGNOSIS — Z975 Presence of (intrauterine) contraceptive device: Secondary | ICD-10-CM

## 2020-03-01 MED ORDER — PRENATAL PLUS 27-1 MG PO TABS: 1.0000 | ORAL_TABLET | Freq: Every day | ORAL | 3 refills | Status: DC

## 2020-03-01 NOTE — Patient Instructions (Signed)
Tips To Increase Milk Supply Lots of water! Enough so that your urine is clear Plenty of calories, if you're not getting enough calories, your milk supply can decrease Breastfeed/pump often, every 2-3 hours x 20-30mins Fenugreek 3 pills 3 times a day, this may make your urine smell like maple syrup Mother's Milk Tea Lactation cookies, google for the recipe Real oatmeal Body Armor sports drinks Greater Than hydration drink  

## 2020-03-01 NOTE — Progress Notes (Signed)
POSTPARTUM VISIT Patient name: Theresa David MRN [id number]  Date of birth: [birth year] Chief Complaint:   Postpartum Care (got Liletta in hosp.)  History of Present Illness:   Theresa David is a 24 y.o. G49P2002 African American female being seen today for a postpartum visit. She is 5 Kelty postpartum following a spontaneous vaginal delivery at 39.0 gestational Goody. IOL: Yes, for borderline SGA. Anesthesia: local.  Laceration: 2nd degree.  Complications: none. Inpatient contraception: yes pp Liletta inserted 01/20/20.   Pregnancy complicated by borderline SGA. Tobacco use: no. Substance use disorder: no. Last pap smear: 12/22/18 and results were normal. Next pap smear due: 2023 No LMP recorded. (Menstrual status: IUD).  Postpartum course has been uncomplicated. Bleeding moderate lochia. Bowel function is normal. Bladder function is normal. Urinary incontinence? No, fecal incontinence? No Patient is not sexually active. Last sexual activity: prior to birth of baby.  Desired contraception: has pp [place]. Patient does want a pregnancy in the future.  Desired family size is 5 children.   Upstream - 03/01/20 0857      Pregnancy Intention Screening   Does the patient want to become pregnant in the next year? No    Does the patient's partner want to become pregnant in the next year? No    Would the patient like to discuss contraceptive options today? No      Contraception Wrap Up   Current Method IUD or IUS    End Method IUD or IUS    Contraception Counseling Provided No          The pregnancy intention screening data noted above was reviewed. Potential methods of contraception were discussed. The patient elected to proceed with IUD or IUS.   Edinburgh Postpartum Depression Screening: negative  Edinburgh Postnatal Depression Scale - 03/01/20 0858      Edinburgh Postnatal Depression Scale:  In the Past 7 Days   I have been able to laugh and see the funny side of things. 0    I have  looked forward with enjoyment to things. 0    I have blamed myself unnecessarily when things went wrong. 1    I have been anxious or worried for no good reason. 0    I have felt scared or panicky for no good reason. 0    Things have been getting on top of me. 1    I have been so unhappy that I have had difficulty sleeping. 0    I have felt sad or miserable. 0    I have been so unhappy that I have been crying. 0    The thought of harming myself has occurred to me. 0    Edinburgh Postnatal Depression Scale Total 2          Baby's course has been uncomplicated. Baby is feeding by breast: milk supply adequate. Infant has a pediatrician/family doctor? Yes.  Childcare strategy if returning to work/school: family.  Pt has material needs met for her and baby: Yes.   Review of Systems:   Pertinent items are noted in HPI Denies Abnormal vaginal discharge w/ itching/odor/irritation, headaches, visual changes, shortness of breath, chest pain, abdominal pain, severe nausea/vomiting, or problems with urination or bowel movements. Pertinent History Reviewed:  Reviewed past medical,surgical, obstetrical and family history.  Reviewed problem list, medications and allergies. OB History  Gravida Para Term Preterm AB Living  '2 2 2 ' 0 0 2  SAB TAB Ectopic Multiple Live Births  0 0 0 0 2    #  Outcome Date GA Lbr Len/2nd Weight Sex Delivery Anes PTL Lv  2 Term 01/20/20 43w0d02:50 6 lb 5.8 oz (2.886 kg) M Vag-Spont None  LIV     Birth Comments: WNL  1 Term 09/23/17 382w1d0:32 / 00:02 5 lb 12.1 oz (2.61 kg) M Vag-Spont Local N LIV     Complications: Cholestasis   Physical Assessment:   Vitals:   03/01/20 0855  BP: 125/87  Pulse: 74  Weight: 162 lb 8 oz (73.7 kg)  Height: '5\' 5"'  (1.651 m)  Body mass index is 27.04 kg/m.       Physical Examination:   General appearance: alert, well appearing, and in no distress  Mental status: alert, oriented to person, place, and time  Skin: warm & dry    Cardiovascular: normal heart rate noted   Respiratory: normal respiratory effort, no distress   Breasts: deferred, no complaints   Abdomen: soft, non-tender   Pelvic: VULVA: lac healing well, VAGINA: normal appearing vagina with normal color and discharge, no lesions, mod menstrual blood CERVIX: normal appearing cervix without discharge or lesions, IUD strings NOT visible  Rectal: no hemorrhoids  Extremities: no edema  Informal TA u/s: IUD in mid-low uterus, above cx, confirmed by [employer] Corporationultrasonographer.  Discussed w/ pt IUD still effective as long as it stays above cx, gave option of replacing, pt wants to think about it and will let usKoreanow if she decides to replace       No results found for this or any previous visit (from the past 24 hour(s)).  Assessment & Plan:  1) Postpartum exam 2) 5 wks s/p spontaneous vaginal delivery after IOL for borderline SGA 3) breast feeding 4) Depression screening 5) IUD in mid-low uterus above cx> per UTD still effective as long as it remains above cx, gave option of replacing, wants to think about it and will let usKoreanow  Essential components of care per ACOG recommendations:  1.  Mood and well being:   If positive depression screen, discussed and plan developed.   If using tobacco we discussed reduction/cessation and risk of relapse  If current substance abuse, we discussed and referral to local resources was offered.   2. Infant care and feeding:   If breastfeeding, discussed returning to work, pumping, breastfeeding-associated pain, guidance regarding return to fertility while lactating if not using another method. If needed, patient was provided with a letter to be allowed to pump q 2-3hrs to support lactation in a private location with access to a refrigerator to store breastmilk.    Recommended that all caregivers be immunized for flu, pertussis and other preventable communicable diseases  If pt does not have material needs met for  her/baby, referred to local resources for help obtaining these.  3. Sexuality, contraception and birth spacing  Provided guidance regarding sexuality, management of dyspareunia, and resumption of intercourse  Discussed avoiding interpregnancy interval <67m49m and recommended birth spacing of 18 months  4. Sleep and fatigue  Discussed coping options for fatigue and sleep disruption  Encouraged family/partner/community support of 4 hrs of uninterrupted sleep to help with mood and fatigue  5. Physical recovery   If pt had a C/S, assessed incisional pain and providing guidance on normal vs prolonged recovery  If pt had a laceration, perineal healing and pain reviewed.   If urinary or fecal incontinence, discussed management and referred to PT or uro/gyn if indicated   Patient is safe to resume physical activity. Discussed attainment of healthy weight.  6.  Chronic disease management  Discussed pregnancy complications if any, and their implications for future childbearing and long-term maternal health.  Review recommendations for prevention of recurrent pregnancy complications, such as 17 hydroxyprogesterone caproate to reduce risk for recurrent PTB not applicable, or aspirin to reduce risk of preeclampsia not applicable.  Pt had GDM: No. If yes, 2hr GTT scheduled: not applicable.  Reviewed medications and non-pregnant dosing including consideration of whether pt is breastfeeding using a reliable resource such as LactMed: not applicable  Referred for f/u w/ PCP or subspecialist providers as indicated: not applicable  7. Health maintenance  Mammogram at 24yo or earlier if indicated  Pap smears as indicated  Meds:  Meds ordered this encounter  Medications   prenatal vitamin w/FE, FA (PRENATAL 1 + 1) 27-1 MG TABS tablet    Sig: Take 1 tablet by mouth daily at 12 noon.    Dispense:  90 tablet    Refill:  3    Order Specific Question:   Supervising Provider    Answer:   Florian Buff [[id number]]    Follow-up: Return in about 1 year (around 03/01/2021) for Physical.   No orders of the defined types were placed in this encounter.   [place], [facility] 03/01/2020 9:26 AM

## 2020-03-08 ENCOUNTER — Ambulatory Visit: Payer: Medicaid Other | Attending: Internal Medicine

## 2020-03-08 DIAGNOSIS — Z23 Encounter for immunization: Secondary | ICD-10-CM

## 2020-03-08 NOTE — Progress Notes (Signed)
   Covid-19 Vaccination Clinic  Name:  Theresa David    MRN: [id number] DOB: 12-03-1995  03/08/2020  [name] was observed post Covid-19 immunization for 15 minutes without incident. She was provided with Vaccine Information Sheet and instruction to access the V-Safe system.   [name] was instructed to call 911 with any severe reactions post vaccine: Marland Kitchen Difficulty breathing  . Swelling of face and throat  . A fast heartbeat  . A bad rash all over body  . Dizziness and weakness   Immunizations Administered    Name Date Dose VIS Date Route   Moderna COVID-19 Vaccine 03/08/2020  9:35 AM 0.5 mL 04/2019 Intramuscular   Manufacturer: Moderna   Lot: [id number]   NDC: 84696-295-28

## 2020-04-18 ENCOUNTER — Other Ambulatory Visit: Payer: Self-pay | Admitting: Internal Medicine

## 2020-04-18 ENCOUNTER — Other Ambulatory Visit: Payer: Medicaid Other

## 2020-04-18 DIAGNOSIS — Z20822 Contact with and (suspected) exposure to covid-19: Secondary | ICD-10-CM

## 2020-04-19 LAB — NOVEL CORONAVIRUS, NAA: SARS-CoV-2, NAA: NOT DETECTED

## 2020-04-19 LAB — SARS-COV-2, NAA 2 DAY TAT

## 2020-04-19 LAB — SPECIMEN STATUS REPORT

## 2020-06-12 ENCOUNTER — Ambulatory Visit (INDEPENDENT_AMBULATORY_CARE_PROVIDER_SITE_OTHER): Payer: Medicaid Other | Admitting: Internal Medicine

## 2020-06-12 ENCOUNTER — Encounter: Payer: Self-pay | Admitting: Internal Medicine

## 2020-06-12 ENCOUNTER — Other Ambulatory Visit: Payer: Self-pay

## 2020-06-12 VITALS — BP 138/82 | HR 65 | Temp 98.7°F | Resp 18 | Ht 65.0 in | Wt 153.1 lb

## 2020-06-12 DIAGNOSIS — L309 Dermatitis, unspecified: Secondary | ICD-10-CM | POA: Diagnosis not present

## 2020-06-12 DIAGNOSIS — Z0000 Encounter for general adult medical examination without abnormal findings: Secondary | ICD-10-CM | POA: Diagnosis not present

## 2020-06-12 DIAGNOSIS — R590 Localized enlarged lymph nodes: Secondary | ICD-10-CM | POA: Diagnosis not present

## 2020-06-12 DIAGNOSIS — J4520 Mild intermittent asthma, uncomplicated: Secondary | ICD-10-CM | POA: Diagnosis not present

## 2020-06-12 NOTE — Assessment & Plan Note (Signed)
Uses Kenalog cream PRN

## 2020-06-12 NOTE — Assessment & Plan Note (Signed)
Tender, intermittent Likely infectious vs inflammatory Differentials could be scalp infection, EBV, CMV, rubella, etc. No rash currently, no ear discharge or pain Check EBV, CMV, rubella antibody along with CBC with differential Use Ketoconazole shampoo for possible fungal infection

## 2020-06-12 NOTE — Progress Notes (Signed)
Established Patient Office Visit  Subjective:  Patient ID: Theresa David, female    DOB: April 24, 1996  Age: 25 y.o. MRN: [id number]  CC:  Chief Complaint  Patient presents with  . Annual Exam    Annual exam pt has knot on the right side of her neck that goes and comes noticed around 05-10-20 sometimes it hurts when it comes back     HPI Theresa David is a 25 year old female with PMH of asthma and eczema who presents for annual physical.  She c/o noticing tender bumps behind her ears. She denies any rash, ear discharge, scalp infection, or fatigue. She denies any cough, dyspnea or night sweats. No change in appetite or weight recently. She had an URTI about a month ago, but states that the bumps have been intermittently present for last few months. She was evaluated at her workplace and was told to get medical evaluation.  She uses Albuterol PRN for asthma.  Past Medical History:  Diagnosis Date  . Asthma   . Bacterial vaginosis   . Eczema 09/06/2012  . Pain with urination 04/26/2015  . Seasonal allergies 09/06/2012  . Unspecified asthma(493.90) 09/06/2012  . Urinary frequency 08/01/2013  . UTI (lower urinary tract infection) 08/01/2013    Past Surgical History:  Procedure Laterality Date  . NO PAST SURGERIES      Family History  Problem Relation Age of Onset  . Cancer Maternal Aunt 44       breast   . Dementia Maternal Grandmother     Social History   Socioeconomic History  . Marital status: Single    Spouse name: Not on file  . Number of children: 1  . Years of education: Not on file  . Highest education level: Not on file  Occupational History  . Not on file  Tobacco Use  . Smoking status: Never Smoker  . Smokeless tobacco: Never Used  Vaping Use  . Vaping Use: Never used  Substance and Sexual Activity  . Alcohol use: No  . Drug use: No  . Sexual activity: Not Currently    Birth control/protection: I.U.D.  Other Topics Concern  . Not on file  Social  History Narrative   Attends college. Wants to be pediatric nurse   Lives with mom    No  smokers   Social Determinants of Health   Financial Resource Strain: Low Risk   . Difficulty of Paying Living Expenses: Not very hard  Food Insecurity: No Food Insecurity  . Worried About Charity fundraiser in the Last Year: Never true  . Ran Out of Food in the Last Year: Never true  Transportation Needs: No Transportation Needs  . Lack of Transportation (Medical): No  . Lack of Transportation (Non-Medical): No  Physical Activity: Insufficiently Active  . Days of Exercise per Week: 3 days  . Minutes of Exercise per Session: 30 min  Stress: No Stress Concern Present  . Feeling of Stress : Not at all  Social Connections: Moderately Integrated  . Frequency of Communication with Friends and Family: Three times a week  . Frequency of Social Gatherings with Friends and Family: Twice a week  . Attends Religious Services: More than 4 times per year  . Active Member of Clubs or Organizations: No  . Attends Archivist Meetings: Never  . Marital Status: Living with partner  Intimate Partner Violence: Not At Risk  . Fear of Current or Ex-Partner: No  . Emotionally Abused: No  . Physically  Abused: No  . Sexually Abused: No    Outpatient Medications Prior to Visit  Medication Sig Dispense Refill  . levonorgestrel (LILETTA, 52 MG,) 19.5 MCG/DAY IUD IUD 1 each by Intrauterine route once.    . prenatal vitamin w/FE, FA (PRENATAL 1 + 1) 27-1 MG TABS tablet Take 1 tablet by mouth daily at 12 noon. (Patient not taking: Reported on 06/12/2020) 90 tablet 3   No facility-administered medications prior to visit.    Allergies  Allergen Reactions  . Shellfish Allergy Anaphylaxis and Rash    SEAFOOD    ROS Review of Systems  Constitutional: Negative for chills and fever.  HENT: Negative for congestion, sinus pressure, sinus pain and sore throat.        Bumps behind the right ear  Eyes:  Negative for pain and discharge.  Respiratory: Negative for cough and shortness of breath.   Cardiovascular: Negative for chest pain and palpitations.  Gastrointestinal: Negative for abdominal pain, constipation, diarrhea, nausea and vomiting.  Endocrine: Negative for polydipsia and polyuria.  Genitourinary: Negative for dysuria and hematuria.  Musculoskeletal: Negative for neck pain and neck stiffness.  Skin: Negative for rash.  Neurological: Negative for dizziness and weakness.  Psychiatric/Behavioral: Negative for agitation and behavioral problems.      Objective:    Physical Exam Vitals reviewed.  Constitutional:      General: She is not in acute distress.    Appearance: She is not diaphoretic.  HENT:     Head: Normocephalic and atraumatic.     Nose: Nose normal. No congestion.     Mouth/Throat:     Mouth: Mucous membranes are moist.     Pharynx: No posterior oropharyngeal erythema.  Eyes:     General: No scleral icterus.    Extraocular Movements: Extraocular movements intact.     Pupils: Pupils are equal, round, and reactive to light.  Neck:     Comments: Tender postauricular LAD Cardiovascular:     Rate and Rhythm: Normal rate and regular rhythm.     Pulses: Normal pulses.     Heart sounds: Normal heart sounds. No murmur heard.   Pulmonary:     Breath sounds: Normal breath sounds. No wheezing or rales.  Abdominal:     Palpations: Abdomen is soft.     Tenderness: There is no abdominal tenderness.  Musculoskeletal:     Cervical back: Neck supple.     Right lower leg: No edema.     Left lower leg: No edema.  Skin:    General: Skin is warm.     Findings: No rash.  Neurological:     General: No focal deficit present.     Mental Status: She is alert and oriented to person, place, and time.     Cranial Nerves: No cranial nerve deficit.     Sensory: No sensory deficit.     Motor: No weakness.  Psychiatric:        Mood and Affect: Mood normal.        Behavior:  Behavior normal.     BP 138/82 (BP Location: Right Arm, Patient Position: Sitting, Cuff Size: Normal)   Pulse 65   Temp 98.7 F (37.1 C) (Oral)   Resp 18   Ht '5\' 5"'  (1.651 m)   Wt 153 lb 1.9 oz (69.5 kg)   SpO2 98%   BMI 25.48 kg/m  Wt Readings from Last 3 Encounters:  06/12/20 153 lb 1.9 oz (69.5 kg)  03/01/20 162 lb 8 oz (73.7  kg)  02/14/20 164 lb 12.8 oz (74.8 kg)     There are no preventive care reminders to display for this patient.  There are no preventive care reminders to display for this patient.  Lab Results  Component Value Date   TSH 3.837 03/10/2013   Lab Results  Component Value Date   WBC 9.3 01/20/2020   HGB 10.7 (L) 01/20/2020   HCT 34.3 (L) 01/20/2020   MCV 95.8 01/20/2020   PLT 288 01/20/2020   Lab Results  Component Value Date   NA 139 12/07/2019   K 5.1 12/07/2019   CO2 21 12/07/2019   GLUCOSE 74 12/07/2019   BUN 9 12/07/2019   CREATININE 0.62 12/07/2019   BILITOT 0.3 12/07/2019   ALKPHOS 145 (H) 12/07/2019   AST 18 12/07/2019   ALT 12 12/07/2019   PROT 7.1 12/07/2019   ALBUMIN 3.6 (L) 12/07/2019   CALCIUM 9.3 12/07/2019   No results found for: CHOL No results found for: HDL No results found for: LDLCALC No results found for: TRIG No results found for: CHOLHDL No results found for: HGBA1C    Assessment & Plan:   Problem List Items Addressed This Visit      Annual physical exam - Primary   Annual exam as documented. Counseling done  re healthy lifestyle involving commitment to 150 minutes exercise per week, heart healthy diet, and attaining healthy weight.The importance of adequate sleep also discussed. Changes in health habits are decided on by the patient with goals and time frames  set for achieving them. Immunization and cancer screening needs are specifically addressed at this visit.     Relevant Orders  EBV ab to viral capsid ag pnl, IgG+IgM  CMV abs, IgG+IgM (cytomegalovirus)  Rubella Antibody, IgM  CBC with  Differential  CMP14+EGFR  Hemoglobin A1c  Lipid panel  Vitamin D 1,25 dihydroxy  TSH + free T4   Immune and Lymphatic  LAD (lymphadenopathy), postauricular   Tender, intermittent Likely infectious vs inflammatory Differentials could be scalp infection, EBV, CMV, rubella, etc. No rash currently, no ear discharge or pain Check EBV, CMV, rubella antibody along with CBC with differential Use Ketoconazole shampoo for possible fungal infection       Respiratory   Mild intermittent asthma    Uses Albuterol inhaler PRN        Musculoskeletal and Integument   Eczema    Uses Kenalog cream PRN       No orders of the defined types were placed in this encounter.   Follow-up: Return in about 4 [place] (around 07/10/2020) for Postauricular LAD.    Lindell Spar, MD

## 2020-06-12 NOTE — Assessment & Plan Note (Signed)
Uses Albuterol inhaler PRN 

## 2020-06-12 NOTE — Assessment & Plan Note (Signed)
Annual exam as documented. Counseling done  re healthy lifestyle involving commitment to 150 minutes exercise per week, heart healthy diet, and attaining healthy weight.The importance of adequate sleep also discussed. Changes in health habits are decided on by the patient with goals and time frames  set for achieving them. Immunization and cancer screening needs are specifically addressed at this visit. 

## 2020-06-12 NOTE — Patient Instructions (Signed)
Please apply Ketoconazole shampoo in scalp to help with possible fungal infection.  Please contact us if you have any ear discharge.  Please contact us if you have any fever, chills or night sweats.

## 2020-06-14 ENCOUNTER — Other Ambulatory Visit: Payer: Self-pay | Admitting: Internal Medicine

## 2020-06-14 ENCOUNTER — Other Ambulatory Visit: Payer: Self-pay | Admitting: *Deleted

## 2020-06-14 DIAGNOSIS — D72819 Decreased white blood cell count, unspecified: Secondary | ICD-10-CM

## 2020-06-20 LAB — PATHOLOGIST SMEAR REVIEW

## 2020-06-20 LAB — SPECIMEN STATUS REPORT

## 2020-06-22 LAB — EBV AB TO VIRAL CAPSID AG PNL, IGG+IGM
EBV VCA IgG: >600 U/mL — ABNORMAL HIGH (ref 0.0–17.9)
EBV VCA IgM: <36 U/mL (ref 0.0–35.9)

## 2020-06-22 LAB — VITAMIN D 1,25 DIHYDROXY
Vitamin D 1, 25 (OH)2 Total: 43 pg/mL
Vitamin D2 1, 25 (OH)2: <10 pg/mL
Vitamin D3 1, 25 (OH)2: 43 pg/mL

## 2020-06-22 LAB — CBC WITH DIFFERENTIAL/PLATELET
Basophils Absolute: 0 10*3/uL (ref 0.0–0.2)
Basos: 1 %
EOS (ABSOLUTE): 0.3 10*3/uL (ref 0.0–0.4)
Eos: 12 %
Hematocrit: 34 % (ref 34.0–46.6)
Hemoglobin: 10.7 g/dL — ABNORMAL LOW (ref 11.1–15.9)
Immature Grans (Abs): 0 10*3/uL (ref 0.0–0.1)
Immature Granulocytes: 0 %
Lymphocytes Absolute: 1.3 10*3/uL (ref 0.7–3.1)
Lymphs: 54 %
MCH: 26.7 pg (ref 26.6–33.0)
MCHC: 31.5 g/dL (ref 31.5–35.7)
MCV: 85 fL (ref 79–97)
Monocytes Absolute: 0.2 10*3/uL (ref 0.1–0.9)
Monocytes: 8 %
Neutrophils Absolute: 0.6 10*3/uL — ABNORMAL LOW (ref 1.4–7.0)
Neutrophils: 25 %
Platelets: 288 10*3/uL (ref 150–450)
RBC: 4.01 x10E6/uL (ref 3.77–5.28)
RDW: 14.4 % (ref 11.7–15.4)
WBC: 2.5 10*3/uL — CL (ref 3.4–10.8)

## 2020-06-22 LAB — LIPID PANEL
Chol/HDL Ratio: 2.7 ratio (ref 0.0–4.4)
Cholesterol, Total: 142 mg/dL (ref 100–199)
HDL: 53 mg/dL (ref >39)
LDL Chol Calc (NIH): 79 mg/dL (ref 0–99)
Triglycerides: 41 mg/dL (ref 0–149)
VLDL Cholesterol Cal: 10 mg/dL (ref 5–40)

## 2020-06-22 LAB — CMP14+EGFR
ALT: 13 IU/L (ref 0–32)
AST: 16 IU/L (ref 0–40)
Albumin/Globulin Ratio: 1.3 (ref 1.2–2.2)
Albumin: 4.1 g/dL (ref 3.9–5.0)
Alkaline Phosphatase: 73 IU/L (ref 44–121)
BUN/Creatinine Ratio: 12 (ref 9–23)
BUN: 8 mg/dL (ref 6–20)
Bilirubin Total: 0.8 mg/dL (ref 0.0–1.2)
CO2: 23 mmol/L (ref 20–29)
Calcium: 9.2 mg/dL (ref 8.7–10.2)
Chloride: 105 mmol/L (ref 96–106)
Creatinine, Ser: 0.69 mg/dL (ref 0.57–1.00)
GFR calc Af Amer: 142 mL/min/{1.73_m2} (ref >59)
GFR calc non Af Amer: 123 mL/min/{1.73_m2} (ref >59)
Globulin, Total: 3.1 g/dL (ref 1.5–4.5)
Glucose: 85 mg/dL (ref 65–99)
Potassium: 4.2 mmol/L (ref 3.5–5.2)
Sodium: 139 mmol/L (ref 134–144)
Total Protein: 7.2 g/dL (ref 6.0–8.5)

## 2020-06-22 LAB — TSH+FREE T4
Free T4: 1.33 ng/dL (ref 0.82–1.77)
TSH: 5.07 u[IU]/mL — ABNORMAL HIGH (ref 0.450–4.500)

## 2020-06-22 LAB — CMV ABS, IGG+IGM (CYTOMEGALOVIRUS)
CMV Ab - IgG: <0.6 U/mL (ref 0.00–0.59)
CMV IgM Ser EIA-aCnc: <30 AU/mL (ref 0.0–29.9)

## 2020-06-22 LAB — HEMOGLOBIN A1C
Est. average glucose Bld gHb Est-mCnc: 103 mg/dL
Hgb A1c MFr Bld: 5.2 % (ref 4.8–5.6)

## 2020-06-22 LAB — RUBELLA ANTIBODY, IGM: Rubella IgM: <20 AU/mL (ref 0.0–19.9)

## 2020-07-10 ENCOUNTER — Ambulatory Visit: Payer: Medicaid Other | Admitting: Internal Medicine

## 2020-08-20 ENCOUNTER — Ambulatory Visit: Payer: Medicaid Other | Admitting: Women's Health

## 2020-09-11 ENCOUNTER — Encounter: Payer: Self-pay | Admitting: Women's Health

## 2020-09-11 ENCOUNTER — Other Ambulatory Visit: Payer: Self-pay

## 2020-09-11 ENCOUNTER — Ambulatory Visit (INDEPENDENT_AMBULATORY_CARE_PROVIDER_SITE_OTHER): Payer: Self-pay | Admitting: Women's Health

## 2020-09-11 VITALS — BP 132/87 | HR 76 | Ht 65.0 in | Wt 150.5 lb

## 2020-09-11 DIAGNOSIS — Z30432 Encounter for removal of intrauterine contraceptive device: Secondary | ICD-10-CM

## 2020-09-11 MED ORDER — LO LOESTRIN FE 1 MG-10 MCG / 10 MCG PO TABS: 1.0000 | ORAL_TABLET | Freq: Every day | ORAL | 3 refills | Status: DC

## 2020-09-11 NOTE — Patient Instructions (Signed)
Oral Contraception Use Oral contraceptive pills (OCPs) are medicines that prevent pregnancy. OCPs work by:  Preventing the ovaries from releasing eggs.  Thickening mucus in the lower part of the uterus (cervix). This prevents sperm from entering the uterus.  Thinning the lining of the uterus (endometrium). This prevents a fertilized egg from attaching to the endometrium. Discuss possible side effects of OCPs with your health care provider. It can take 2-3 months for your body to adjust to changes in hormone levels. What are the risks? OCPs can sometimes cause side effects, such as:  Headache.  Depression.  Trouble sleeping.  Nausea and vomiting.  Breast tenderness.  Irregular bleeding or spotting during the first several months.  Bloating or fluid retention.  Increase in blood pressure. OCPs with estrogen and progestins may slightly increase the risk of:  Blood clots.  Heart attack.  Stroke. How to take OCPs Follow instructions from your health care provider about how to take your first cycle of OCPs. There are 2 types of OCPs. The first, combination OCPs, have both estrogen and progestins. The second, progestin-only pills, have only progestin.  For combination OCPs, you may start the pill: ? On day 1 of your menstrual period. ? On the first Sunday after your period starts, or on the day you get your prescription. ? At any time of your cycle. ? If you start taking the pill within 5 days after the start of your period, you will not need a backup form of birth control, such as condoms. ? If you start at any other time of your menstrual cycle, you will need to use a backup form of birth control.  For progestin-only OCPs: ? Ideally, you can start taking the pill on the first day of your menstrual period, but you can start it on any other day too. ? These pills will protect you from pregnancy after taking it for 2 days (48 hours). You can stop using a backup form of birth  control after that time. It is important that you take this pill at the same time every day. Even taking it 3 hours late can increase the risk of pregnancy. No matter which day you start the OCP, you will always start a new pack on that same day of the week. Have an extra pack of OCPs and a backup contraceptive method available in case you miss some pills or lose your OCP pack. Missed doses Follow instructions from your health care provider for missed doses. Information about missed doses can also be found in the patient information sheet that comes with your pack of pills.  In general, for combined OCPs: ? If you forget to take the pill for 1 day, take it as soon as you can. This may mean taking 2 pills on the same day and at the same time. Take the next day's pill at the regular time. ? If you forget to take the pill for 2 days in a row, take 2 tablets on the day you remember and 2 tablets on the following day. A backup form of birth control should be used for 7 days after you are back on schedule. ? If you forget to take the pill for 3 days in a row, call your health care provider for directions on when to restart taking your pills. Do not take the missed pills. A backup form of birth control will be needed for 7 days once you restart your pills. ? If you use a pack that   contains inactive pills and you miss 1 or more of the inactive pills, you do not need to take the missed doses. Skip them and start the new pack on the regular day.  For progestin-only OCPs: ? If your dose is 3 hours or more late, or if you miss 1 or more doses, take 1 missed pill as soon as you can. ? If you miss one or more doses, you must use a backup form of birth control. Some brands of progestin-only pills recommend using a backup form of birth control for 48 hours after a missed or late dose while others recommend 7 days. If you are not sure what to do, call your health care provider or check the patient information sheet that  came with your pills.   Follow these instructions at home:  Do not use any products that contain nicotine or tobacco. These include cigarettes, chewing tobacco, or vaping devices, such as e-cigarettes. If you need help quitting, ask your health care provider.  Always use a condom to protect against STIs (sexually transmitted infections). Oral contraception pills do not protect against STIs.  Use a calendar to mark the days of your menstrual period.  Read the information sheet and directions that came with your OCP. Talk to your health care provider if you have questions. Contact a health care provider if:  You develop nausea and vomiting.  You have abnormal vaginal discharge or bleeding.  You develop a rash.  You miss your menstrual period. Depending on the type of OCP you are taking, this may be a sign of pregnancy.  You are losing your hair.  You need treatment for mood swings or depression.  You get dizzy when taking the OCP.  You develop acne after taking the OCP.  You become pregnant or think you may be pregnant.  You have diarrhea, constipation, and abdominal pain or cramps.  You are not sure what to do after missing pills. Get help right away if:  You develop chest pain.  You develop shortness of breath.  You have an uncontrolled or severe headache.  You develop numbness or slurred speech.  You develop vision or speech problems.  You develop pain, redness, and swelling in your legs.  You develop weakness or numbness in your arms or legs. These symptoms may represent a serious problem that is an emergency. Do not wait to see if the symptoms will go away. Get medical help right away. Call your local emergency services (911 in the [place] Do not drive yourself to the hospital. Summary  Oral contraceptive pills (OCPs) are medicines that you take to prevent pregnancy.  OCPs do not prevent sexually transmitted infections (STIs). Always use a condom to protect  against STIs.  When you start an OCP, be aware that it can take 2-3 months for your body to adjust to changes in hormone levels.  Read all the information and directions that come with your OCP. This information is not intended to replace advice given to you by your health care provider. Make sure you discuss any questions you have with your health care provider. Document Revised: 01/12/2020 Document Reviewed: 01/12/2020 Elsevier Patient Education  2021 Elsevier Inc.  

## 2020-09-11 NOTE — Progress Notes (Signed)
   IUD REMOVAL  Patient name: Theresa David MRN [id number]  Date of birth: 1996/01/26 Subjective Findings:   Theresa David is a 25 y.o. G31P2002 African American female being seen today for removal of a Liletta  IUD. Her IUD was placed postplacentally 01/20/20.  She desires removal because it is malpositioned. Discovered at pp visit, u/s revealed IUD in mid-low uterus above cx, per UTD still effective as long as above cx. Wanted to think about leaving in vs removing. Has now decided to remove it. Signed copy of informed consent in chart. Initial bp elevated- states she was nervous.  Depression screen [facility] 2/9 06/12/2020 02/14/2020 10/24/2019 07/18/2019 12/22/2018  Decreased Interest 0 0 0 0 0  Down, Depressed, Hopeless 0 0 0 0 0  PHQ - 2 Score 0 0 0 0 0  Altered sleeping - - 0 0 -  Tired, decreased energy - - 1 0 -  Change in appetite - - 0 0 -  Feeling bad or failure about yourself  - - 0 0 -  Trouble concentrating - - 0 0 -  Moving slowly or fidgety/restless - - 0 0 -  Suicidal thoughts - - 0 0 -  PHQ-9 Score - - 1 0 -  Difficult doing work/chores - - Not difficult at all - -    Patient's last menstrual period was 07/31/2020 (approximate). Last pap8/5/20. Results were: NILM w/ HRHPV negative The planned method of family planning is OCP (estrogen/progesterone). Does not smoke, no h/o HTN, DVT/PE, CVA, MI, or migraines w/ aura.  Pertinent History Reviewed:   Reviewed past medical,surgical, social, obstetrical and family history.  Reviewed problem list, medications and allergies. Objective Findings & Procedure:    Vitals:   09/11/20 0844 09/11/20 0848  BP: (!) 134/92 132/87  Pulse: 78 76  Weight: 150 lb 8 oz (68.3 kg)   Height: 5\' 5"  (1.651 m)   Body mass index is 25.04 kg/m.  No results found for this or any previous visit (from the past 24 hour(s)).   Time out was performed.  A graves speculum was placed in the vagina.  The cervix was visualized, and the strings were not visible. Curved  [place] forceps inserted through os, IUD easily grasped, was upside down. T-part of IUD was presenting and removed intact w/o difficulty. The patient tolerated the procedure well.   Chaperone:   Assessment & Plan:   1) Liletta  IUD removal> placed postplacentally, malpositioned Follow-up prn problems  2) Contraception management> rx LoLoestrin, condoms x 2wks, f/u Malachy Mood  No orders of the defined types were placed in this encounter.   Follow-up: Return in about 3 months (around 12/11/2020) for med f/u, in person, CNM.  12/13/2020 CNM, [facility] 09/11/2020 9:11 AM

## 2020-12-11 ENCOUNTER — Ambulatory Visit: Payer: Self-pay | Admitting: Adult Health

## 2021-01-25 ENCOUNTER — Encounter (HOSPITAL_COMMUNITY): Payer: Self-pay

## 2021-01-25 ENCOUNTER — Emergency Department (HOSPITAL_COMMUNITY)
Admission: EM | Admit: 2021-01-25 | Discharge: 2021-01-25 | Disposition: A | Discharge status: Home / Self Care | Payer: Self-pay | Attending: Emergency Medicine | Admitting: Emergency Medicine

## 2021-01-25 ENCOUNTER — Other Ambulatory Visit: Payer: Self-pay

## 2021-01-25 DIAGNOSIS — Z5321 Procedure and treatment not carried out due to patient leaving prior to being seen by health care provider: Secondary | ICD-10-CM | POA: Insufficient documentation

## 2021-01-25 DIAGNOSIS — R109 Unspecified abdominal pain: Secondary | ICD-10-CM | POA: Insufficient documentation

## 2021-01-25 DIAGNOSIS — U071 COVID-19: Secondary | ICD-10-CM | POA: Insufficient documentation

## 2021-01-25 LAB — LIPASE, BLOOD: Lipase: 33 U/L (ref 11–51)

## 2021-01-25 LAB — CBC
HCT: 40.3 % (ref 36.0–46.0)
Hemoglobin: 12.3 g/dL (ref 12.0–15.0)
MCH: 28.3 pg (ref 26.0–34.0)
MCHC: 30.5 g/dL (ref 30.0–36.0)
MCV: 92.6 fL (ref 80.0–100.0)
Platelets: 286 10*3/uL (ref 150–400)
RBC: 4.35 MIL/uL (ref 3.87–5.11)
RDW: 13.2 % (ref 11.5–15.5)
WBC: 7.4 10*3/uL (ref 4.0–10.5)
nRBC: 0 % (ref 0.0–0.2)

## 2021-01-25 LAB — COMPREHENSIVE METABOLIC PANEL
ALT: 16 U/L (ref 0–44)
AST: 19 U/L (ref 15–41)
Albumin: 4.1 g/dL (ref 3.5–5.0)
Alkaline Phosphatase: 56 U/L (ref 38–126)
Anion gap: 7 (ref 5–15)
BUN: 18 mg/dL (ref 6–20)
CO2: 24 mmol/L (ref 22–32)
Calcium: 8.5 mg/dL — ABNORMAL LOW (ref 8.9–10.3)
Chloride: 103 mmol/L (ref 98–111)
Creatinine, Ser: 0.8 mg/dL (ref 0.44–1.00)
GFR, Estimated: >60 mL/min (ref >60)
Glucose, Bld: 115 mg/dL — ABNORMAL HIGH (ref 70–99)
Potassium: 3.5 mmol/L (ref 3.5–5.1)
Sodium: 134 mmol/L — ABNORMAL LOW (ref 135–145)
Total Bilirubin: 0.4 mg/dL (ref 0.3–1.2)
Total Protein: 8.4 g/dL — ABNORMAL HIGH (ref 6.5–8.1)

## 2021-01-25 NOTE — ED Notes (Signed)
RN called for Pt from waiting room. Pt reported to Screener she was leaving prior to this RN going to bring her to a room.

## 2021-01-25 NOTE — ED Triage Notes (Signed)
Pt tested positive for covid yesterday at home. Pt stated she is having"stomach inside pain". Pt vitals are WNL. NAD.

## 2021-02-28 ENCOUNTER — Telehealth: Payer: Self-pay | Admitting: *Deleted

## 2021-02-28 NOTE — Telephone Encounter (Signed)
Needs appt scheduled no appt in system. No Showed last follow up needs [facility] SCREEN With this visit

## 2021-02-28 NOTE — Telephone Encounter (Signed)
Called patient left voicemail to call office to schedule an appointment 

## 2021-08-29 ENCOUNTER — Ambulatory Visit: Payer: BC Managed Care – PPO | Admitting: Adult Health

## 2021-08-29 ENCOUNTER — Encounter: Payer: Self-pay | Admitting: Adult Health

## 2021-08-29 VITALS — BP 128/80 | HR 74 | Ht 65.0 in | Wt 146.0 lb

## 2021-08-29 DIAGNOSIS — N926 Irregular menstruation, unspecified: Secondary | ICD-10-CM | POA: Diagnosis not present

## 2021-08-29 DIAGNOSIS — Z3202 Encounter for pregnancy test, result negative: Secondary | ICD-10-CM

## 2021-08-29 DIAGNOSIS — Z113 Encounter for screening for infections with a predominantly sexual mode of transmission: Secondary | ICD-10-CM | POA: Diagnosis not present

## 2021-08-29 DIAGNOSIS — Z30011 Encounter for initial prescription of contraceptive pills: Secondary | ICD-10-CM | POA: Diagnosis not present

## 2021-08-29 LAB — POCT URINE PREGNANCY: Preg Test, Ur: NEGATIVE

## 2021-08-29 MED ORDER — BALCOLTRA 0.1-20 MG-MCG(21) PO TABS: 1.0000 | ORAL_TABLET | Freq: Every day | ORAL | 0 refills | Status: DC

## 2021-08-29 NOTE — Progress Notes (Signed)
?  Subjective:  ?  ? Patient ID: Theresa David, female   DOB: Dec 26, 1995, 26 y.o.   MRN: [id number] ? ?HPI ?Theresa David is a 26 year old black female, single, G2P2 in complaining of irregular bleeding, last periods was 2 1/2 Tant , and she stopped the sprintec. Had not missed any pills. ?She is thinking of being a surrogate. ? ?Lab Results  ?Component Value Date  ? DIAGPAP  12/22/2018  ?  NEGATIVE FOR INTRAEPITHELIAL LESIONS OR MALIGNANCY.  ? HPV NOT Detected 12/22/2018  ? PCP is Dr Posey Pronto. ? ?Review of Systems ?Irregular bleeding ?Reviewed past medical,surgical, social and family history. Reviewed medications and allergies.  ?   ?Objective:  ? Physical Exam ?BP 128/80 (BP Location: Right Arm, Patient Position: Sitting, Cuff Size: Normal)   Pulse 74   Ht 5\' 5"  (1.651 m)   Wt 146 lb (66.2 kg)   LMP 08/06/2021   Breastfeeding No   BMI 24.30 kg/m?  UPT is negative  ?  Skin warm and dry. Lungs clear to auscultation bilaterally. Cardiovascular: regular rate and rhythm.  ? [facility] - 08/29/21 0843   ? ?  ? Pregnancy Intention Screening  ? Does the patient want to become pregnant in the next year? No   ? Does the patient's partner want to become pregnant in the next year? No   ? Would the patient like to discuss contraceptive options today? Yes   ?  ? Contraception Wrap Up  ? Current Method Oral Contraceptive   ? End Method Oral Contraceptive   ? Contraception Counseling Provided No   ? ?  ?  ? ?  ?  ?Assessment:  ?   ?1. Pregnancy test negative ? ?2. Encounter for initial prescription of contraceptive pills ?Will give sample balcoltra to start today,take 1 daily, 3 packs given use condoms for 1 month ? ?3. Irregular bleeding ?Will try new OC ? ?4. Screening examination for STD (sexually transmitted disease) ?Urine sent for GC/CHL ?   ?Plan:  ?   ?Follow up in 10 [place] for ROS ?   ?

## 2021-08-31 LAB — GC/CHLAMYDIA PROBE AMP
Chlamydia trachomatis, NAA: NEGATIVE
Neisseria Gonorrhoeae by PCR: NEGATIVE

## 2021-11-07 ENCOUNTER — Ambulatory Visit: Payer: BC Managed Care – PPO | Admitting: Adult Health

## 2021-11-07 ENCOUNTER — Other Ambulatory Visit: Payer: Self-pay | Admitting: Adult Health

## 2021-11-07 MED ORDER — BALCOLTRA 0.1-20 MG-MCG(21) PO TABS: 1.0000 | ORAL_TABLET | Freq: Every day | ORAL | 3 refills | Status: DC

## 2021-11-07 NOTE — Progress Notes (Signed)
Refilled balcoltra

## 2021-12-24 ENCOUNTER — Encounter: Payer: Self-pay | Admitting: Adult Health

## 2021-12-24 ENCOUNTER — Other Ambulatory Visit (HOSPITAL_COMMUNITY)
Admission: RE | Admit: 2021-12-24 | Discharge: 2021-12-24 | Disposition: A | Discharge status: Home / Self Care | Payer: BC Managed Care – PPO | Source: Ambulatory Visit | Attending: Adult Health | Admitting: Adult Health

## 2021-12-24 ENCOUNTER — Ambulatory Visit: Payer: BC Managed Care – PPO | Admitting: Adult Health

## 2021-12-24 ENCOUNTER — Other Ambulatory Visit: Payer: BC Managed Care – PPO

## 2021-12-24 VITALS — BP 136/87 | HR 82 | Ht 65.0 in | Wt 145.0 lb

## 2021-12-24 DIAGNOSIS — Z124 Encounter for screening for malignant neoplasm of cervix: Secondary | ICD-10-CM | POA: Insufficient documentation

## 2021-12-24 DIAGNOSIS — N898 Other specified noninflammatory disorders of vagina: Secondary | ICD-10-CM | POA: Diagnosis not present

## 2021-12-24 DIAGNOSIS — Z113 Encounter for screening for infections with a predominantly sexual mode of transmission: Secondary | ICD-10-CM

## 2021-12-24 DIAGNOSIS — B3731 Acute candidiasis of vulva and vagina: Secondary | ICD-10-CM

## 2021-12-24 MED ORDER — FLUCONAZOLE 150 MG PO TABS: ORAL_TABLET | ORAL | 1 refills | Status: DC

## 2021-12-24 NOTE — Progress Notes (Signed)
  Subjective:     Patient ID: Theresa David, female   DOB: Apr 12, 1996, 26 y.o.   MRN: [id number]  HPI Theresa David is a 26 year old black female,single, G2P2 in complaining of vaginal discharge, was watery,now clumpy, and itching, since Thursday, had sex Saturday has swelling and used monistat.  She needs a pap too. Lab Results  Component Value Date   DIAGPAP  12/22/2018    NEGATIVE FOR INTRAEPITHELIAL LESIONS OR MALIGNANCY.   HPV NOT Detected 12/22/2018   PCP is Dr Allena Katz.  Review of Systems +vaginal discharge +vaginal itching  +swelling Denies any new partners or products  Denies any burning with urination Reviewed past medical,surgical, social and family history. Reviewed medications and allergies.     Objective:   Physical Exam BP 136/87 (BP Location: Right Arm, Patient Position: Sitting, Cuff Size: Normal)   Pulse 82   Ht 5\' 5"  (1.651 m)   Wt 145 lb (65.8 kg)   LMP 12/05/2021   Breastfeeding No   BMI 24.13 kg/m     Skin warm and dry.Pelvic: external genitalia is normal in appearance no lesions,but left labia is swollen, vagina: white discharge without odor,red vaginal sidewalls,urethra has no lesions or masses noted, cervix:smooth and bulbous, pap  obtained, as was CV swab, cleaned discharge out of vagina with big Q tip,uterus: normal size, shape and contour, non tender, no masses felt, adnexa: no masses or tenderness noted. Bladder is non tender and no masses felt.  [facility] - 12/24/21 1605       Pregnancy Intention Screening   Does the patient want to become pregnant in the next year? No    Does the patient's partner want to become pregnant in the next year? No    Would the patient like to discuss contraceptive options today? No      Contraception Wrap Up   Current Method Oral Contraceptive    End Method Oral Contraceptive    Contraception Counseling Provided No            Examination chaperoned by 02/23/22 LPN  Assessment:     1. Vaginal irritation CV  swab sent  - Cervicovaginal ancillary only( [place])  2. Screen for STD (sexually transmitted disease) CV swab sent for GC/CHL,trich, BV and yeast  - Cervicovaginal ancillary only( [place])  3. Vaginal discharge   4. Vaginal itching   5. Routine Papanicolaou smear Pap sent Pap in 3 years if normal - Cytology - PAP( [place])  6. Yeast vaginitis +discharge with red sidewalls  Will rx diflucan Meds ordered this encounter  Medications   fluconazole (DIFLUCAN) 150 MG tablet    Sig: Take 1 now and 1 in 3 days    Dispense:  2 tablet    Refill:  1    Order Specific Question:   Supervising Provider    Answer:   Faith Rogue [[id number]]       Plan:     Follow up prn

## 2021-12-26 LAB — CERVICOVAGINAL ANCILLARY ONLY
Bacterial Vaginitis (gardnerella): NEGATIVE
Candida Glabrata: NEGATIVE
Candida Vaginitis: POSITIVE — AB
Chlamydia: NEGATIVE
Comment: NEGATIVE
Comment: NEGATIVE
Comment: NEGATIVE
Comment: NEGATIVE
Comment: NEGATIVE
Comment: NORMAL
Neisseria Gonorrhea: NEGATIVE
Trichomonas: NEGATIVE

## 2021-12-27 LAB — CYTOLOGY - PAP
Diagnosis: NEGATIVE
Diagnosis: REACTIVE

## 2023-06-21 ENCOUNTER — Encounter: Payer: Self-pay | Admitting: Emergency Medicine

## 2023-06-21 ENCOUNTER — Ambulatory Visit
Admission: EM | Admit: 2023-06-21 | Discharge: 2023-06-21 | Disposition: A | Discharge status: Home / Self Care | Payer: 59 | Attending: Internal Medicine | Admitting: Internal Medicine

## 2023-06-21 DIAGNOSIS — Z20828 Contact with and (suspected) exposure to other viral communicable diseases: Secondary | ICD-10-CM | POA: Diagnosis not present

## 2023-06-21 DIAGNOSIS — J4520 Mild intermittent asthma, uncomplicated: Secondary | ICD-10-CM

## 2023-06-21 MED ORDER — ALBUTEROL SULFATE HFA 108 (90 BASE) MCG/ACT IN AERS: 1.0000 | INHALATION_SPRAY | Freq: Four times a day (QID) | RESPIRATORY_TRACT | 0 refills | Status: AC | PRN

## 2023-06-21 MED ORDER — OSELTAMIVIR PHOSPHATE 75 MG PO CAPS: 75.0000 mg | ORAL_CAPSULE | Freq: Two times a day (BID) | ORAL | 0 refills | Status: DC

## 2023-06-21 NOTE — ED Provider Notes (Signed)
[facility]    CSN: [id number] Arrival date & time: 06/21/23  1223      History   Chief Complaint Chief Complaint  Patient presents with   Follow-up    HPI Theresa David is a 28 y.o. female.   Theresa David is a 28 y.o. female presenting for chief complaint of exposure to influenza.  Her 50-year-old son with severe asthma is currently in the ICU for influenza A.  Her 62-year-old son just tested positive for flu a yesterday.  She does not have any symptoms and denies fever, chills, sore throat, N/V/D, abdominal pain, rash, and cough/congestion.  She has a personal history of asthma and has not needed to use her albuterol inhaler recently.  Asthma is well-controlled with only as needed use of inhaler.  Never smoker, denies drug use.  No recent antibiotic or steroid use reported.     Past Medical History:  Diagnosis Date   Asthma    Bacterial vaginosis    Eczema 09/06/2012   Pain with urination 04/26/2015   Seasonal allergies 09/06/2012   Unspecified asthma(493.90) 09/06/2012   Urinary frequency 08/01/2013   UTI (lower urinary tract infection) 08/01/2013    Patient Active Problem List   Diagnosis Date Noted   Yeast vaginitis 12/24/2021   Routine Papanicolaou smear 12/24/2021   Vaginal itching 12/24/2021   Vaginal discharge 12/24/2021   Screen for STD (sexually transmitted disease) 12/24/2021   Vaginal irritation 12/24/2021   Encounter for initial prescription of contraceptive pills 08/29/2021   Pregnancy test negative 08/29/2021   Irregular bleeding 08/29/2021   Screening examination for STD (sexually transmitted disease) 08/29/2021   LAD (lymphadenopathy), postauricular 06/12/2020   History of cholestasis during pregnancy 09/23/2017   Stress and adjustment 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 Authorizing Provider  albuterol (VENTOLIN HFA) 108 (90 Base) MCG/ACT inhaler Inhale 1-2 puffs into the lungs every 6 (six) hours as needed for wheezing or shortness of breath. 06/21/23  Yes Carlisle Beers, FNP  oseltamivir (TAMIFLU) 75 MG capsule Take 1 capsule (75 mg total) by mouth every 12 (twelve) hours. 06/21/23  Yes Carlisle Beers, FNP  fluconazole (DIFLUCAN) 150 MG tablet Take 1 now and 1 in 3 days Patient not taking: Reported on 06/21/2023 12/24/21   [name], NP  Levonorgest-Eth Estrad-Fe Bisg (BALCOLTRA) 0.1-20 MG-MCG(21) TABS Take 1 tablet by mouth daily. 11/07/21   [name], NP    Family History Family History  Problem Relation Age of Onset   Cancer Maternal Aunt 80       breast    Dementia Maternal Grandmother     Social History Social History   Tobacco Use   Smoking status: Never   Smokeless tobacco: Never  Vaping Use   Vaping status: Never Used  Substance Use Topics  Alcohol use: No   Drug use: No     Allergies   Shellfish allergy   Review of Systems Review of Systems Per HPI  Physical Exam Triage Vital Signs ED Triage Vitals  Encounter Vitals Group     BP 06/21/23 1229 (!) 138/98     Systolic BP Percentile --      Diastolic BP Percentile --      Pulse Rate 06/21/23 1229 (!) 104     Resp 06/21/23 1229 16     Temp 06/21/23 1229 98.5 F (36.9 C)     Temp Source 06/21/23 1229 Oral     SpO2 06/21/23 1229 98 %     Weight --      Height --      Head Circumference --      Peak Flow --      Pain Score 06/21/23 1230 0     Pain Loc --      Pain Education --      Exclude from Growth Chart --    No data found.  Updated Vital Signs BP (!) 138/98 (BP Location: Right Arm)   Pulse (!) 104    Temp 98.5 F (36.9 C) (Oral)   Resp 16   SpO2 98%   Visual Acuity Right Eye Distance:   Left Eye Distance:   Bilateral Distance:    Right Eye Near:   Left Eye Near:    Bilateral Near:     Physical Exam Vitals and nursing note reviewed.  Constitutional:      Appearance: She is not ill-appearing or toxic-appearing.  HENT:     Head: Normocephalic and atraumatic.     Right Ear: Hearing, tympanic membrane, ear canal and external ear normal.     Left Ear: Hearing, tympanic membrane, ear canal and external ear normal.     Nose: Nose normal.     Mouth/Throat:     Lips: Pink.     Mouth: Mucous membranes are moist. No injury or oral lesions.     Dentition: Normal dentition.     Tongue: No lesions.     Pharynx: Oropharynx is clear. Uvula midline. No pharyngeal swelling, oropharyngeal exudate, posterior oropharyngeal erythema, uvula swelling or postnasal drip.     Tonsils: No tonsillar exudate.  Eyes:     General: Lids are normal. Vision grossly intact. Gaze aligned appropriately.     Extraocular Movements: Extraocular movements intact.     Conjunctiva/sclera: Conjunctivae normal.  Neck:     Trachea: Trachea and phonation normal.  Cardiovascular:     Rate and Rhythm: Normal rate and regular rhythm.     Heart sounds: Normal heart sounds, S1 normal and S2 normal.  Pulmonary:     Effort: Pulmonary effort is normal. No respiratory distress.     Breath sounds: Normal breath sounds and air entry.  Musculoskeletal:     Cervical back: Neck supple.  Lymphadenopathy:     Cervical: No cervical adenopathy.  Skin:    General: Skin is warm and dry.     Capillary Refill: Capillary refill takes less than 2 seconds.     Findings: No rash.  Neurological:     General: No focal deficit present.     Mental Status: She is alert and oriented to person, place, and time. Mental status is at baseline.     Cranial Nerves: No dysarthria or facial asymmetry.  Psychiatric:        Mood and Affect: Mood  normal.  Speech: Speech normal.        Behavior: Behavior normal.        Thought Content: Thought content normal.        Judgment: Judgment normal.      UC Treatments / Results  Labs (all labs ordered are listed, but only abnormal results are displayed) Labs Reviewed - No data to display  EKG   Radiology No results found.  Procedures Procedures (including critical care time)  Medications Ordered in UC Medications - No data to display  Initial Impression / Assessment and Plan / UC Course  I have reviewed the triage vital signs and the nursing notes.  Pertinent labs & imaging results that were available during my care of the patient were reviewed by me and considered in my medical decision making (see chart for details).   1.  Exposure to influenza, mild intermittent asthma without complication Tamiflu prescribed given guidelines for postexposure prophylaxis flu treatment for flu A on up-to-date. She has been exposed to flu in the last 48 hours, therefore she meets criteria for Tamiflu twice daily for 5 days. Albuterol inhaler refilled for as needed use of shortness of breath and cough if she develops the symptoms. Recommend supportive care for symptomatic relief as outlined in AVS.  Work note given.  Counseled patient on potential for adverse effects with medications prescribed/recommended today, strict ER and return-to-clinic precautions discussed, patient verbalized understanding.    Final Clinical Impressions(s) / UC Diagnoses   Final diagnoses:  Exposure to influenza  Mild intermittent asthma without complication     Discharge Instructions      Take tamiflu every 12 hours for the next 5 days. Use albuterol inhaler as needed.  If you develop any new or worsening symptoms or if your symptoms do not start to improve, please return here or follow-up with your primary care provider. If your symptoms are severe, please go to the emergency room.     ED  Prescriptions     Medication Sig Dispense Auth. Provider   oseltamivir (TAMIFLU) 75 MG capsule Take 1 capsule (75 mg total) by mouth every 12 (twelve) hours. 10 capsule Carlisle Beers, FNP   albuterol (VENTOLIN HFA) 108 (90 Base) MCG/ACT inhaler Inhale 1-2 puffs into the lungs every 6 (six) hours as needed for wheezing or shortness of breath. 8 g Carlisle Beers, FNP      PDMP not reviewed this encounter.   Carlisle Beers, [place] 06/21/23 1254

## 2023-06-21 NOTE — Discharge Instructions (Signed)
Take tamiflu every 12 hours for the next 5 days. Use albuterol inhaler as needed.  If you develop any new or worsening symptoms or if your symptoms do not start to improve, please return here or follow-up with your primary care provider. If your symptoms are severe, please go to the emergency room.

## 2023-06-21 NOTE — ED Triage Notes (Signed)
Pt presents for flu testing due to sons positive testing for flu

## 2023-09-16 ENCOUNTER — Ambulatory Visit: Admitting: Adult Health

## 2023-09-16 ENCOUNTER — Encounter: Payer: Self-pay | Admitting: Adult Health

## 2023-09-16 ENCOUNTER — Other Ambulatory Visit (HOSPITAL_COMMUNITY)
Admission: RE | Admit: 2023-09-16 | Discharge: 2023-09-16 | Disposition: A | Discharge status: Home / Self Care | Source: Ambulatory Visit | Attending: Adult Health | Admitting: Adult Health

## 2023-09-16 VITALS — BP 127/80 | HR 91 | Ht 65.0 in | Wt 154.0 lb

## 2023-09-16 DIAGNOSIS — Z01419 Encounter for gynecological examination (general) (routine) without abnormal findings: Secondary | ICD-10-CM | POA: Diagnosis present

## 2023-09-16 DIAGNOSIS — Z1331 Encounter for screening for depression: Secondary | ICD-10-CM

## 2023-09-16 NOTE — Progress Notes (Addendum)
 Patient ID: Theresa David, female   DOB: 1995/09/25, 28 y.o.   MRN: [id number] History of Present Illness: Theresa David is a 28 year old black female, with SO, G2P2002 in for a well woman gyn exam and pap. She is thinking about being a surrogate.  PCP is Dr Lydia Sams    Current Medications, Allergies, Past Medical History, Past Surgical History, Family History and Social History were reviewed in [employer] electronic medical record.     Review of Systems: Patient denies any headaches, hearing loss, fatigue, blurred vision, shortness of breath, chest pain, abdominal pain, problems with bowel movements, urination, or intercourse. No joint pain or mood swings.  Has wisdom teeth pain, to see dentist    Physical Exam:BP 127/80 (BP Location: Left Arm, Patient Position: Sitting, Cuff Size: Large)   Pulse 91   Ht 5\' 5"  (1.651 m)   Wt 154 lb (69.9 kg)   LMP 08/26/2023 (Exact Date)   BMI 25.63 kg/m   General:  Well developed, well nourished, no acute distress Skin:  Warm and dry Neck:  Midline trachea, normal thyroid, good ROM, no lymphadenopathy Lungs; Clear to auscultation bilaterally Breast:  No dominant palpable mass, retraction, or nipple discharge Cardiovascular: Regular rate and rhythm Abdomen:  Soft, non tender, no hepatosplenomegaly Pelvic:  External genitalia is normal in appearance, no lesions.  The vagina is normal in appearance. Urethra has no lesions or masses. The cervix is bulbous.Pap with HR HPV genotyping performed.  Uterus is felt to be normal size, shape, and contour.  No adnexal masses or tenderness noted.Bladder is non tender, no masses felt. Extremities/musculoskeletal:  No swelling or varicosities noted, no clubbing or cyanosis Psych:  No mood changes, alert and cooperative,seems happy AA is 1 Fall risk is low    09/16/2023    8:39 AM 06/12/2020    9:13 AM 02/14/2020    9:23 AM  Depression screen PHQ 2/9  Decreased Interest 1 0 0  Down, Depressed, Hopeless 1 0 0  PHQ  - 2 Score 2 0 0  Altered sleeping 0    Tired, decreased energy 2    Change in appetite 0    Feeling bad or failure about yourself  0    Trouble concentrating 0    Moving slowly or fidgety/restless 0    Suicidal thoughts 0    PHQ-9 Score 4         09/16/2023    8:40 AM 10/24/2019    9:03 AM  GAD 7 : Generalized Anxiety Score  Nervous, Anxious, on Edge 1 0  Control/stop worrying 0 0  Worry too much - different things 1 0  Trouble relaxing 1 1  Restless 0 0  Easily annoyed or irritable 1 2  Afraid - awful might happen 2 0  Total GAD 7 Score 6 3  Has therapist     Upstream - 09/16/23 1478       Pregnancy Intention Screening   Does the patient want to become pregnant in the next year? No    Does the patient's partner want to become pregnant in the next year? No    Would the patient like to discuss contraceptive options today? No      Contraception Wrap Up   Current Method Withdrawal or Other Method    End Method Withdrawal or Other Method    Contraception Counseling Provided No             Examination chaperoned by Alphonso Aschoff LPN  Impression and plan:  1. Encounter for gynecological examination with Papanicolaou smear of cervix (Primary) Pap sent Pap in 3 years if normal Physical in year  Labs with PCP Return 10/01/23 to see Dr Ozan for clearance has to be MD or DO to be surrogate  - Cytology - PAP( [place])

## 2023-09-21 LAB — CYTOLOGY - PAP
Comment: NEGATIVE
Diagnosis: NEGATIVE
Diagnosis: REACTIVE
High risk HPV: NEGATIVE

## 2023-10-01 ENCOUNTER — Ambulatory Visit: Admitting: Obstetrics & Gynecology

## 2023-10-15 ENCOUNTER — Telehealth: Payer: Self-pay | Admitting: Adult Health

## 2023-10-15 NOTE — Telephone Encounter (Signed)
 Left message to call me back about form

## 2024-03-30 ENCOUNTER — Other Ambulatory Visit: Payer: Self-pay | Admitting: Medical Genetics

## 2024-03-30 DIAGNOSIS — Z006 Encounter for examination for normal comparison and control in clinical research program: Secondary | ICD-10-CM

## 2024-04-11 LAB — GENECONNECT MOLECULAR SCREEN: Genetic Analysis Overall Interpretation: NEGATIVE
